# Patient Record
Sex: Female | Born: 1960 | Race: Black or African American | Hispanic: No | Marital: Single | Smoking: Current every day smoker
Health system: Southern US, Community
[De-identification: ages and names within clinical notes are randomized; demographics above are authoritative.]

## PROBLEM LIST (undated history)

## (undated) DIAGNOSIS — M109 Gout, unspecified: Secondary | ICD-10-CM

## (undated) DIAGNOSIS — I10 Essential (primary) hypertension: Secondary | ICD-10-CM

## (undated) HISTORY — PX: ABDOMINAL HYSTERECTOMY: SHX81

---

## 2002-06-06 ENCOUNTER — Ambulatory Visit (HOSPITAL_COMMUNITY): Admission: RE | Admit: 2002-06-06 | Discharge: 2002-06-06 | Payer: Self-pay | Admitting: General Surgery

## 2008-04-20 ENCOUNTER — Emergency Department (HOSPITAL_COMMUNITY): Admission: EM | Admit: 2008-04-20 | Discharge: 2008-04-20 | Payer: Self-pay | Admitting: Emergency Medicine

## 2008-11-12 ENCOUNTER — Encounter: Payer: Self-pay | Admitting: Internal Medicine

## 2008-11-28 ENCOUNTER — Ambulatory Visit (HOSPITAL_COMMUNITY): Admission: RE | Admit: 2008-11-28 | Discharge: 2008-11-28 | Payer: Self-pay | Admitting: Internal Medicine

## 2008-11-28 ENCOUNTER — Ambulatory Visit: Payer: Self-pay | Admitting: Internal Medicine

## 2010-12-15 NOTE — Op Note (Signed)
[name], [name]                 ACCOUNT NO.:  [id number]   MEDICAL RECORD NO.:  [id number]          PATIENT TYPE:  AMB   LOCATION:  DAY                           FACILITY:  [facility]   PHYSICIAN:  R. Roetta Sessions, M.D. DATE OF BIRTH:  03/21/1961   DATE OF PROCEDURE:  11/28/2008  DATE OF DISCHARGE:                               OPERATIVE REPORT   PROCEDURE:  Screening colonoscopy.   INDICATIONS FOR PROCEDURE:  A pleasant 51 year old African American lady  sent over at the courtesy of Dr. Claretta Fraise at [facility] in [place] for screening colonoscopy.  She never had her  lower tract imaged.  She is devoid of any lower GI tract symptoms.  There is no family history of colon polyps or colon cancer.  Colonoscopy  is now being done as a screening maneuver.  Risks, benefits,  alternatives and limitations have been reviewed, questions answered.  She is agreeable.  Please see the documentation in the medical record.   PROCEDURE NOTE:  O2 saturation, blood pressure, pulse and respirations  monitored throughout the entire procedure.  Conscious sedation Versed 4  mg IV, Demerol 75 mg IV in divided doses.  Instrument Pentax video chip  system.   FINDINGS:  Digital rectal exam revealed no abnormalities.  Endoscopic  findings:  The prep was good except for the right side where it was  relatively poor.  Colon:  Colonic mucosa was surveyed from the  rectosigmoid junction through the left transverse right colon to the  appendiceal orifice and ileocecal valve and cecum.  These structures  were well seen and photographed for the record.  From this level the  scope was slowly withdrawn.  All previously mentioned mucosal surfaces  were again seen.  The patient had few scattered pancolonic diverticula.  There was a thin coating of tenacious stool throughout the ascending  colon which in spite of copious washing and lavage was difficult to get  entirely off there.  Therefore the  finer mucosal detail of the ascending  segment was not seen as well if the prep were better.  However, the  patient was noted to have a few scattered pancolonic diverticula.  I saw  no evidence of neoplasia or polyp although a small lesion may have been  obscured by the poor prep on the right side.  The scope was pulled down  into the rectum where thorough examination of the rectal mucosa  including retroflexed view of the anal verge demonstrated no  abnormalities.  The patient tolerated the procedure well and was  reactive after endoscopy.   IMPRESSION:  Normal rectum, few scattered pancolonic diverticular,  suboptimal prep in the ascending segment compromised exam.   RECOMMENDATIONS:  1. Diverticulosis literature provided to Ms. Lorenda Hatchet.  2. Recommend repeat screening colonoscopy 10 years.      Jonathon Bellows, M.D.  Electronically Signed     RMR/MEDQ  D:  11/28/2008  T:  11/28/2008  Job:  [id number]   cc:   Dr Claretta Fraise

## 2010-12-18 NOTE — Op Note (Signed)
   NAMECYNIA, ABRUZZO                           ACCOUNT NO.:  [id number]   MEDICAL RECORD NO.:  [id number]                   PATIENT TYPE:  AMB   LOCATION:  DAY                                  FACILITY:  [facility]   PHYSICIAN:  Dalia Heading, M.D.               DATE OF BIRTH:  [birth year]   DATE OF PROCEDURE:  06/06/2002  DATE OF DISCHARGE:                                 OPERATIVE REPORT   PREOPERATIVE DIAGNOSIS:  Enlarging lipoma, left arm.   POSTOPERATIVE DIAGNOSIS:  Enlarging lipoma, left arm.   PROCEDURE:  Excision of greater than 9 cm lipoma, left arm.   SURGEON:  Dalia Heading, M.D.   ANESTHESIA:  MAC.   COMPLICATIONS:  None.   SPECIMENS:  Lipoma, left arm.   ESTIMATED BLOOD LOSS:  Minimal.   INDICATIONS:  The patient is a 50 year old black female who presents with an  enlarging mass along her left arm.  The risks and benefits of the procedure,  including bleeding, infection, and recurrence of the mass, were fully  explained to the patient, who gave informed consent.   DESCRIPTION OF PROCEDURE:  The patient was placed in the supine position.  The left arm was prepped and draped using the usual sterile technique with  Betadine.  Surgical site confirmation was performed.   Xylocaine 1% was used for local anesthesia.  A longitudinal incision was  made over the mass.  The mass was then excised in total without difficulty.  A large lipoma was found.  Any bleeding was controlled using Bovie  electrocautery.  The subcutaneous layer was reapproximated using a 3-0  Vicryl interrupted suture.  The skin was closed using a 4-0 Monocryl  interrupted suture.  Betadine ointment and dry sterile dressing were  applied.   All tape and needle counts correct at the end of the procedure.  The patient  was transferred to the PACU in stable condition.                                              Dalia Heading, M.D.   MAJ/MEDQ  D:  06/06/2002  T:  06/06/2002  Job:  [id number]   cc:    Dr. Loistine Chance

## 2010-12-18 NOTE — H&P (Signed)
   [name], [name]                             ACCOUNT NO.:  [id number]   MEDICAL RECORD NO.:  [id number]                  PATIENT TYPE:   LOCATION:                                       FACILITY:   PHYSICIAN:  Dalia Heading, M.D.               DATE OF BIRTH:  May 23, 1961   DATE OF ADMISSION:  06/06/2002  DATE OF DISCHARGE:                                HISTORY & PHYSICAL   CHIEF COMPLAINT:  Enlarging mass of left arm.   HISTORY OF PRESENT ILLNESS:  The patient is a 50 year old black female who  was referred for evaluation and treatment of an enlarging mass on the left  arm.  It  has been present for over a year, but recently has increased in  size.  No drainage has been noted.  No neuropathy has been noted.   PAST MEDICAL HISTORY:  Hypertension.   PAST SURGICAL HISTORY:  Hysteroscopy.   CURRENT MEDICATIONS:  1. Procardia.  2. Lasix.  3. Baby aspirin.   ALLERGIES:  HYDROCHLOROTHIAZIDE.   REVIEW OF SYMPTOMS:  The patient does smoke a half of a packs of cigarettes  a day.  She denies any alcohol use.  She denies any other cardiopulmonary  difficulties or bleeding disorders.   PHYSICAL EXAMINATION:  GENERAL APPEARANCE:  The patient is a well-developed,  well-nourished, black female in no acute distress.  VITAL SIGNS:  She is afebrile and vital signs are stable.  LUNGS:  Clear to auscultation with good breath sounds bilaterally.  HEART:  Regular rate and rhythm without S3, S4, or murmurs.  EXTREMITIES:  A 6 cm, ovoid subcutaneous mass noted in the left arm.  No  punctum is present.  The left arm is neurovascularly intact.   IMPRESSION:  Enlarging mass of left arm.    PLAN:  The patient is scheduled for excision of the mass of the left arm on  June 06, 2002.  The risks and benefits of the procedure, including  bleeding, infection, and recurrence of the mass were fully explained to the  patient, who gave informed consent.         Dalia Heading, M.D.    MAJ/MEDQ  D:  05/22/2002  T:  05/22/2002  Job:  [id number]   cc:   Dr. Loistine Chance

## 2011-05-03 LAB — POCT CARDIAC MARKERS
CKMB, poc: 1.8
Myoglobin, poc: 64.9
Troponin i, poc: <0.05

## 2011-05-03 LAB — BASIC METABOLIC PANEL
BUN: 12
Calcium: 8.7
GFR calc non Af Amer: 54 — ABNORMAL LOW
Glucose, Bld: 176 — ABNORMAL HIGH
Potassium: 3.3 — ABNORMAL LOW
Sodium: 138

## 2012-04-02 ENCOUNTER — Encounter (HOSPITAL_COMMUNITY): Payer: Self-pay

## 2012-04-02 ENCOUNTER — Observation Stay (HOSPITAL_COMMUNITY)
Admission: EM | Admit: 2012-04-02 | Discharge: 2012-04-03 | Disposition: A | Discharge status: Home / Self Care | Payer: BC Managed Care – PPO | Attending: Internal Medicine | Admitting: Internal Medicine

## 2012-04-02 ENCOUNTER — Emergency Department (HOSPITAL_COMMUNITY): Payer: BC Managed Care – PPO

## 2012-04-02 DIAGNOSIS — R05 Cough: Secondary | ICD-10-CM | POA: Insufficient documentation

## 2012-04-02 DIAGNOSIS — J449 Chronic obstructive pulmonary disease, unspecified: Secondary | ICD-10-CM | POA: Diagnosis present

## 2012-04-02 DIAGNOSIS — R739 Hyperglycemia, unspecified: Secondary | ICD-10-CM | POA: Diagnosis present

## 2012-04-02 DIAGNOSIS — R059 Cough, unspecified: Secondary | ICD-10-CM | POA: Insufficient documentation

## 2012-04-02 DIAGNOSIS — J9801 Acute bronchospasm: Secondary | ICD-10-CM

## 2012-04-02 DIAGNOSIS — Z720 Tobacco use: Secondary | ICD-10-CM | POA: Diagnosis present

## 2012-04-02 DIAGNOSIS — R7309 Other abnormal glucose: Secondary | ICD-10-CM | POA: Insufficient documentation

## 2012-04-02 DIAGNOSIS — I10 Essential (primary) hypertension: Secondary | ICD-10-CM | POA: Diagnosis present

## 2012-04-02 DIAGNOSIS — J45901 Unspecified asthma with (acute) exacerbation: Secondary | ICD-10-CM

## 2012-04-02 DIAGNOSIS — R062 Wheezing: Secondary | ICD-10-CM | POA: Insufficient documentation

## 2012-04-02 DIAGNOSIS — J441 Chronic obstructive pulmonary disease with (acute) exacerbation: Principal | ICD-10-CM | POA: Insufficient documentation

## 2012-04-02 DIAGNOSIS — R0602 Shortness of breath: Secondary | ICD-10-CM | POA: Insufficient documentation

## 2012-04-02 DIAGNOSIS — J40 Bronchitis, not specified as acute or chronic: Secondary | ICD-10-CM | POA: Diagnosis present

## 2012-04-02 DIAGNOSIS — E669 Obesity, unspecified: Secondary | ICD-10-CM | POA: Diagnosis present

## 2012-04-02 DIAGNOSIS — Z6841 Body Mass Index (BMI) 40.0 and over, adult: Secondary | ICD-10-CM | POA: Insufficient documentation

## 2012-04-02 DIAGNOSIS — F17200 Nicotine dependence, unspecified, uncomplicated: Secondary | ICD-10-CM | POA: Insufficient documentation

## 2012-04-02 HISTORY — DX: Essential (primary) hypertension: I10

## 2012-04-02 MED ORDER — ALBUTEROL SULFATE (5 MG/ML) 0.5% IN NEBU
2.5000 mg | INHALATION_SOLUTION | Freq: Once | RESPIRATORY_TRACT | Status: AC
  Administered 2012-04-02: 2.5 mg via RESPIRATORY_TRACT
  Filled 2012-04-02: qty 0.5

## 2012-04-02 MED ORDER — ALBUTEROL SULFATE HFA 108 (90 BASE) MCG/ACT IN AERS
2.0000 | INHALATION_SPRAY | RESPIRATORY_TRACT | Status: DC | PRN
  Administered 2012-04-02: 2 via RESPIRATORY_TRACT

## 2012-04-02 MED ORDER — ALBUTEROL SULFATE HFA 108 (90 BASE) MCG/ACT IN AERS: 2.0000 | INHALATION_SPRAY | RESPIRATORY_TRACT | Status: DC | PRN

## 2012-04-02 MED ORDER — AMOXICILLIN 500 MG PO CAPS: 500.0000 mg | ORAL_CAPSULE | Freq: Three times a day (TID) | ORAL | Status: DC

## 2012-04-02 MED ORDER — ALBUTEROL SULFATE (5 MG/ML) 0.5% IN NEBU
2.5000 mg | INHALATION_SOLUTION | Freq: Once | RESPIRATORY_TRACT | Status: AC
  Administered 2012-04-03: 2.5 mg via RESPIRATORY_TRACT
  Filled 2012-04-02: qty 0.5

## 2012-04-02 MED ORDER — ALBUTEROL SULFATE HFA 108 (90 BASE) MCG/ACT IN AERS
INHALATION_SPRAY | RESPIRATORY_TRACT | Status: AC
  Filled 2012-04-02: qty 6.7

## 2012-04-02 NOTE — ED Notes (Signed)
Patient presents with tachycardia and tachypnea, extreme work of breathing, 02 sats drop into 80's with any exertion ie attempt to move up in bed.

## 2012-04-02 NOTE — ED Provider Notes (Signed)
History   Scribed for Benny Lennert, MD, the patient was seen in room APA09/APA09 . This chart was scribed by Lewanda Rife.   CSN: [id number]  Arrival date & time 04/02/12  2003   First MD Initiated Contact with Patient 04/02/12 2038      Chief Complaint  Patient presents with  . Shortness of Breath    (Consider location/radiation/quality/duration/timing/severity/associated sxs/prior Treatment) [name] is a 51 y.o. female who presents to the Emergency Department complaining of shortness of breath since last night and worse today. Pt reports associated congestion and productive cough.  Patient is a 51 y.o. female presenting with shortness of breath. The history is provided by the patient.  Shortness of Breath  The current episode started yesterday. The problem occurs frequently. The problem has been resolved. The problem is mild. The symptoms are relieved by one or more prescription drugs and beta-agonist inhalers (pt reports running out of her inhalers). Nothing aggravates the symptoms. Associated symptoms include cough, shortness of breath and wheezing. Pertinent negatives include no fever. The cough is productive.    Past Medical History  Diagnosis Date  . Hypertension     Past Surgical History  Procedure Date  . Abdominal hysterectomy     Family History  Problem Relation Age of Onset  . Diabetes Mother   . Hypertension Mother   . Diabetes Father     History  Substance Use Topics  . Smoking status: Passive Smoker    Types: Cigarettes  . Smokeless tobacco: Not on file  . Alcohol Use: Yes     occasional    OB History    Grav Para Term Preterm Abortions TAB SAB Ect Mult Living                  Review of Systems  Constitutional: Negative.  Negative for fever.  HENT: Negative.   Eyes: Negative for discharge.  Respiratory: Positive for cough, shortness of breath and wheezing.   Cardiovascular: Negative.   Gastrointestinal: Negative.     Genitourinary: Negative for frequency and hematuria.  Musculoskeletal: Negative.   Skin: Negative.   Neurological: Negative.   Hematological: Negative.   Psychiatric/Behavioral: Negative.   All other systems reviewed and are negative.    Allergies  Hctz  Home Medications  No current outpatient prescriptions on file.  BP 195/104  Pulse 126  Temp 98.3 F (36.8 C) (Oral)  Resp 24  Ht 5\' 5"  (1.651 m)  Wt 320 lb (145.151 kg)  BMI 53.25 kg/m2  SpO2 97%  Physical Exam  Nursing note and vitals reviewed. Constitutional: She is oriented to person, place, and time. She appears well-developed.  HENT:  Head: Normocephalic and atraumatic.  Eyes: Conjunctivae and EOM are normal. No scleral icterus.  Neck: Neck supple. No thyromegaly present.  Cardiovascular: Regular rhythm.  Tachycardia present.  Exam reveals no gallop and no friction rub.   No murmur heard. Pulmonary/Chest: No stridor. She has wheezes (minimal bilateral wheezing ). She has no rales. She exhibits no tenderness.  Abdominal: She exhibits no distension. There is no tenderness. There is no rebound.  Musculoskeletal: Normal range of motion. She exhibits no edema.  Lymphadenopathy:    She has no cervical adenopathy.  Neurological: She is oriented to person, place, and time. Coordination normal.  Skin: No rash noted. No erythema.  Psychiatric: She has a normal mood and affect. Her behavior is normal.    ED Course  Procedures (including critical care time)  Labs Reviewed -  No data to display No results found.   No diagnosis found.    MDM        The chart was scribed for me under my direct supervision.  I personally performed the history, physical, and medical decision making and all procedures in the evaluation of this patient.Benny Lennert, MD 04/02/12 2255

## 2012-04-02 NOTE — ED Notes (Signed)
RT paged for tx.

## 2012-04-02 NOTE — ED Notes (Signed)
Pt sts SOB started yesterday afternoon and has become increasingly worse.

## 2012-04-02 NOTE — ED Notes (Signed)
Trial off 02 prior to discharge x 30 minutes

## 2012-04-03 DIAGNOSIS — R739 Hyperglycemia, unspecified: Secondary | ICD-10-CM | POA: Diagnosis present

## 2012-04-03 DIAGNOSIS — Z720 Tobacco use: Secondary | ICD-10-CM | POA: Diagnosis present

## 2012-04-03 DIAGNOSIS — E669 Obesity, unspecified: Secondary | ICD-10-CM | POA: Diagnosis present

## 2012-04-03 DIAGNOSIS — J40 Bronchitis, not specified as acute or chronic: Secondary | ICD-10-CM

## 2012-04-03 DIAGNOSIS — J449 Chronic obstructive pulmonary disease, unspecified: Secondary | ICD-10-CM | POA: Diagnosis present

## 2012-04-03 DIAGNOSIS — J441 Chronic obstructive pulmonary disease with (acute) exacerbation: Secondary | ICD-10-CM

## 2012-04-03 DIAGNOSIS — I10 Essential (primary) hypertension: Secondary | ICD-10-CM | POA: Diagnosis present

## 2012-04-03 LAB — CBC WITH DIFFERENTIAL/PLATELET
Basophils Absolute: 0.1 10*3/uL (ref 0.0–0.1)
Basophils Relative: 1 % (ref 0–1)
Eosinophils Relative: 3 % (ref 0–5)
HCT: 42.9 % (ref 36.0–46.0)
MCHC: 33.1 g/dL (ref 30.0–36.0)
MCV: 90.5 fL (ref 78.0–100.0)
Monocytes Absolute: 0.6 10*3/uL (ref 0.1–1.0)
Platelets: 229 10*3/uL (ref 150–400)
RDW: 13.8 % (ref 11.5–15.5)

## 2012-04-03 LAB — BASIC METABOLIC PANEL
Calcium: 9.2 mg/dL (ref 8.4–10.5)
Creatinine, Ser: 0.9 mg/dL (ref 0.50–1.10)
GFR calc Af Amer: 83 mL/min — ABNORMAL LOW (ref >90)
GFR calc non Af Amer: 72 mL/min — ABNORMAL LOW (ref >90)
Sodium: 138 mEq/L (ref 135–145)

## 2012-04-03 LAB — HEMOGLOBIN A1C: Hgb A1c MFr Bld: 5.9 % — ABNORMAL HIGH (ref <5.7)

## 2012-04-03 MED ORDER — ALBUTEROL SULFATE (5 MG/ML) 0.5% IN NEBU: 2.5000 mg | INHALATION_SOLUTION | RESPIRATORY_TRACT | Status: DC | PRN

## 2012-04-03 MED ORDER — POTASSIUM CHLORIDE CRYS ER 20 MEQ PO TBCR
40.0000 meq | EXTENDED_RELEASE_TABLET | Freq: Two times a day (BID) | ORAL | Status: DC
  Administered 2012-04-03: 40 meq via ORAL
  Filled 2012-04-03: qty 2

## 2012-04-03 MED ORDER — PREDNISONE 20 MG PO TABS
60.0000 mg | ORAL_TABLET | Freq: Every day | ORAL | Status: DC
  Administered 2012-04-03: 60 mg via ORAL
  Filled 2012-04-03: qty 3

## 2012-04-03 MED ORDER — AZITHROMYCIN 250 MG PO TABS
500.0000 mg | ORAL_TABLET | Freq: Every day | ORAL | Status: DC
  Administered 2012-04-03: 500 mg via ORAL
  Filled 2012-04-03: qty 2
  Filled 2012-04-03: qty 1

## 2012-04-03 MED ORDER — IPRATROPIUM BROMIDE 0.02 % IN SOLN
0.5000 mg | Freq: Four times a day (QID) | RESPIRATORY_TRACT | Status: DC
  Administered 2012-04-03 (×2): 0.5 mg via RESPIRATORY_TRACT
  Filled 2012-04-03 (×2): qty 2.5

## 2012-04-03 MED ORDER — AZITHROMYCIN 250 MG PO TABS: 250.0000 mg | ORAL_TABLET | Freq: Every day | ORAL | Status: DC

## 2012-04-03 MED ORDER — ALBUTEROL SULFATE (5 MG/ML) 0.5% IN NEBU
2.5000 mg | INHALATION_SOLUTION | Freq: Four times a day (QID) | RESPIRATORY_TRACT | Status: DC
  Administered 2012-04-03 (×2): 2.5 mg via RESPIRATORY_TRACT
  Filled 2012-04-03 (×2): qty 0.5

## 2012-04-03 MED ORDER — ASPIRIN EC 81 MG PO TBEC
81.0000 mg | DELAYED_RELEASE_TABLET | Freq: Every day | ORAL | Status: DC
  Administered 2012-04-03: 81 mg via ORAL
  Filled 2012-04-03: qty 1

## 2012-04-03 MED ORDER — METOPROLOL TARTRATE 50 MG PO TABS
50.0000 mg | ORAL_TABLET | Freq: Two times a day (BID) | ORAL | Status: DC
  Administered 2012-04-03: 50 mg via ORAL
  Filled 2012-04-03: qty 1

## 2012-04-03 MED ORDER — BIOTENE DRY MOUTH MT LIQD
15.0000 mL | Freq: Two times a day (BID) | OROMUCOSAL | Status: DC
  Administered 2012-04-03: 15 mL via OROMUCOSAL

## 2012-04-03 MED ORDER — NIFEDIPINE ER OSMOTIC RELEASE 30 MG PO TB24
90.0000 mg | ORAL_TABLET | Freq: Every day | ORAL | Status: DC
  Administered 2012-04-03: 90 mg via ORAL
  Filled 2012-04-03: qty 3

## 2012-04-03 MED ORDER — PREDNISONE 10 MG PO TABS: ORAL_TABLET | ORAL | Status: DC

## 2012-04-03 MED ORDER — IPRATROPIUM-ALBUTEROL 18-103 MCG/ACT IN AERO: 2.0000 | INHALATION_SPRAY | RESPIRATORY_TRACT | Status: AC | PRN

## 2012-04-03 NOTE — ED Provider Notes (Signed)
Prior to discharge the patient had low oxygen saturations with ambulation and significant dyspnea. On repeat examination the patient has diffuse bilateral expiratory wheezing, tachypnea, no accessory muscle use. She is obese, strong peripheral pulses but has a tachycardia of 120. She states that she does better after receiving albuterol nebulized treatments but still needs more time. I agree with her assessment, will admit to the hospital. Heparin labs, pending at this time.  Vida Roller, MD 04/03/12 Lyda Jester

## 2012-04-03 NOTE — Progress Notes (Signed)
UR chart review completed.  

## 2012-04-03 NOTE — ED Notes (Signed)
02 sats drop into mid to high 80s with any exertion - walked 10 ft total to and from bathroom.  Discussed with Dr Hyacinth Meeker.  Ambulated patient in hallway - patient stopped to "catch her breath" at first turn at desk, then stopped a second time with half the distance- initial 02 sat 78-80 then with rest and deep breaths returned to high 90's and hit 100% at one time.  Chest auscultated by Dr Hyacinth Meeker.  Patient placed in wheelchair and return to her room

## 2012-04-03 NOTE — ED Notes (Signed)
Up to bathroom prior to discharge - still appears very dyspneic, breathing labored.  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  Has her inhaler and instructions on use.

## 2012-04-03 NOTE — ED Notes (Signed)
Contact Information for Sister:  Allayne Butcher  [phone]

## 2012-04-03 NOTE — Progress Notes (Signed)
[facility] TELEMETRY UNIT [address] [place] [address] Phone: [phone] Fax: [phone]  April 03, 2012  Patient: [name]  Date of Birth: [birth year]  Date of Visit: 04/02/2012    To Whom It May Concern:  Solimar Maiden was seen and treated in our emergency department on 04/02/2012. Addison Lank  may return to work on 04/10/12.  Sincerely,      Crista Curb, M.D.

## 2012-04-03 NOTE — Discharge Summary (Signed)
Physician Discharge Summary  Patient ID: [name] MRN: [id number] DOB/AGE: June 05, 1961 51 y.o.  Admit date: 04/02/2012 Discharge date: 04/03/2012  Discharge Diagnoses:  Principal Problem:  *COPD exacerbation Active Problems:  Bronchitis  Tobacco abuse  Hypertension  Hyperglycemia  Obesity   Medication List  As of 04/03/2012 10:44 AM   TAKE these medications         albuterol-ipratropium 18-103 MCG/ACT inhaler   Commonly known as: COMBIVENT   Inhale 2 puffs into the lungs every 4 (four) hours as needed for wheezing or shortness of breath.      aspirin EC 81 MG tablet   Take 81 mg by mouth daily.      azithromycin 250 MG tablet   Commonly known as: ZITHROMAX   Take 1 tablet (250 mg total) by mouth daily.      metoprolol 50 MG tablet   Commonly known as: LOPRESSOR   Take 50 mg by mouth 2 (two) times daily.      NIFEdipine 90 MG 24 hr tablet   Commonly known as: PROCARDIA XL/ADALAT-CC   Take 90 mg by mouth daily.      predniSONE 10 MG tablet   Commonly known as: DELTASONE   4 tablets daily for 2 days, then decrease by 1 tablet every 2 days until off.            Discharge Orders    Future Orders Please Complete By Expires   Diet - low sodium heart healthy      Increase activity slowly      Discharge instructions      Comments:   Quit smoking      Follow-up Information    Follow up with your primary care provider. (If symptoms worsen)          Disposition: home  Discharged Condition: stable  Consults:  none  Labs:   Results for orders placed during the hospital encounter of 04/02/12 (from the past 48 hour(s))  CBC WITH DIFFERENTIAL     Status: Abnormal   Collection Time   04/03/12  1:31 AM      Component Value Range Comment   WBC 10.8 (*) 4.0 - 10.5 K/uL    RBC 4.74  3.87 - 5.11 MIL/uL    Hemoglobin 14.2  12.0 - 15.0 g/dL    HCT 91.4  78.2 - 95.6 %    MCV 90.5  78.0 - 100.0 fL    MCH 30.0  26.0 - 34.0 pg    MCHC 33.1  30.0 - 36.0 g/dL    RDW  21.3  08.6 - 57.8 %    Platelets 229  150 - 400 K/uL    Neutrophils Relative 73  43 - 77 %    Neutro Abs 7.9 (*) 1.7 - 7.7 K/uL    Lymphocytes Relative 18  12 - 46 %    Lymphs Abs 1.9  0.7 - 4.0 K/uL    Monocytes Relative 6  3 - 12 %    Monocytes Absolute 0.6  0.1 - 1.0 K/uL    Eosinophils Relative 3  0 - 5 %    Eosinophils Absolute 0.3  0.0 - 0.7 K/uL    Basophils Relative 1  0 - 1 %    Basophils Absolute 0.1  0.0 - 0.1 K/uL   BASIC METABOLIC PANEL     Status: Abnormal   Collection Time   04/03/12  1:31 AM      Component Value Range Comment  Sodium 138  135 - 145 mEq/L    Potassium 3.5  3.5 - 5.1 mEq/L    Chloride 103  96 - 112 mEq/L    CO2 23  19 - 32 mEq/L    Glucose, Bld 151 (*) 70 - 99 mg/dL    BUN 13  6 - 23 mg/dL    Creatinine, Ser 1.61  0.50 - 1.10 mg/dL    Calcium 9.2  8.4 - 09.6 mg/dL    GFR calc non Af Amer 72 (*) >90 mL/min    GFR calc Af Amer 83 (*) >90 mL/min   GLUCOSE, CAPILLARY     Status: Abnormal   Collection Time   04/03/12  8:01 AM      Component Value Range Comment   Glucose-Capillary 119 (*) 70 - 99 mg/dL     Diagnostics:  Dg Chest 2 View  04/02/2012  *RADIOLOGY REPORT*  Clinical Data: Hypertension, cough, wheezing  CHEST - 2 VIEW  Comparison: None  Findings: Lordotic positioning. Normal heart size and pulmonary vascularity. Tortuous aorta. Peribronchial thickening without infiltrate or effusion. No pneumothorax. Bones unremarkable.  IMPRESSION: Mild bronchitic changes.   Original Report Authenticated By: Lollie Marrow, M.D.    Procedures:  none  Hospital Course: See H&P for complete admission details. The patient is a 51 year old black female smoker who presented with cough shortness of breath and wheezing. She was given steroids bronchodilators in the emergency room. She improved but had hypoxia with ambulation so she was admitted to the hospitalist service. Chest x-ray is negative. She improved quickly with antibiotics steroids oxygen and bronchodilators.  She was counseled to quit smoking and she wishes to do so. Her oxygen saturations are normal with ambulation this morning and she is requesting discharge. She was given a note for work. She is to take the rest of the week off.  Discharge Exam:  Blood pressure 146/81, pulse 110, temperature 97.8 F (36.6 C), temperature source Oral, resp. rate 20, height 5\' 5"  (1.651 m), weight 145.151 kg (320 lb), SpO2 98.00%.  General: Comfortable. Able to speak in complete sentences. Breathing nonlabored. Coughing. Lungs clear to auscultation bilaterally without wheeze rhonchi or rales Cardiovascular regular rate rhythm without murmurs gallops rubs Abdomen obese soft nontender Extremities no clubbing cyanosis or edema  Signed: Amarah Brossman L 04/03/2012, 10:44 AM

## 2012-04-03 NOTE — Progress Notes (Signed)
Patient is being discharged home. Pt ambulated without oxygen therapy and tolerated ambulation well. Patient given discharge instructions and MD note for work. Pt verbalized understanding of discharge instructions and new medications. Pt advised to progress activity slowly. [name] D 04/03/2012

## 2012-04-03 NOTE — H&P (Signed)
[facility] History and Physical  ALEXEI EY ZOX:[id number] DOB: [birth year] DOA: 04/02/2012  Referring physician: Antonieta Loveless PCP: No primary provider on file.   Chief Complaint: Dyspnea  HPI: [name] is a 51 y.o. female with moderate hypertension followed by [facility] and recurrent bronchitis who presented to ED with worsening dyspnea, wheezing and cough. She has frequent episodes of bronchitis but has never formally been diagnosed with asthma and does not use bronchodilators at home. On arrival she had wheezing, tachycardia and hypoxia on exertion, she was treated with continuous albuterol nebulizer with significant relief and was slated for discharge home, but just before leaving the ED felt her wheezing and dyspnea return and on ambulatory O2 testing desaturated to the 80's. She has no chest pain, a mild croupy non-productive cough, no fevers, and no hemoptysis.   Review of Systems: The patient denies anorexia, fever, weight loss,, vision loss, decreased hearing, hoarseness, chest pain, syncope, peripheral edema, balance deficits, hemoptysis, abdominal pain, melena, hematochezia, severe indigestion/heartburn, hematuria, incontinence, genital sores, muscle weakness, suspicious skin lesions, transient blindness, difficulty walking, depression, unusual weight change, abnormal bleeding, enlarged lymph nodes, angioedema, and breast masses.    Past Medical History  Diagnosis Date  . Hypertension    Past Surgical History  Procedure Date  . Abdominal hysterectomy    Social History:  reports that she has been passively smoking Cigarettes.  She does not have any smokeless tobacco history on file. She reports that she drinks alcohol. She reports that she does not use illicit drugs.  Allergies  Allergen Reactions  . Hctz (Hydrochlorothiazide) Rash    Family History  Problem Relation Age of Onset  . Diabetes Mother   . Hypertension Mother   . Diabetes Father     Prior to Admission medications   Medication Sig Start Date End Date Taking? Authorizing Provider  aspirin EC 81 MG tablet Take 81 mg by mouth daily.   Yes Historical Provider, MD  metoprolol (LOPRESSOR) 50 MG tablet Take 50 mg by mouth 2 (two) times daily.   Yes Historical Provider, MD  NIFEdipine (PROCARDIA XL/ADALAT-CC) 90 MG 24 hr tablet Take 90 mg by mouth daily.   Yes Historical Provider, MD  amoxicillin (AMOXIL) 500 MG capsule Take 1 capsule (500 mg total) by mouth 3 (three) times daily. 04/02/12 04/12/12  Benny Lennert, MD   Physical Exam: Filed Vitals:   04/02/12 2330 04/03/12 0000 04/03/12 0001 04/03/12 0017  BP: 143/93  158/95   Pulse: 121  114 124  Temp:      TempSrc:      Resp: 26  25 31   Height:      Weight:      SpO2: 93% 92% 92% 95%     General:  Obese, no distress, speaks in full sentences  Eyes: PERRL, red conjunctiva  ENT: no exudates or erythema, no nasal congestion  Neck: supple, no adenopathy  Cardiovascular: Tachy, regular no mrg  Respiratory: Diffuse scattered wheezing and anterior rhonchi, no acessory muscle use, fair air movement  Abdomen: soft NT, no HSM  Skin: no rashes  Musculoskeletal: normal  Psychiatric: normal  Neurologic: non-focal  Labs on Admission:  Basic Metabolic Panel:  Lab 04/03/12 8119  NA 138  K 3.5  CL 103  CO2 23  GLUCOSE 151*  BUN 13  CREATININE 0.90  CALCIUM 9.2  MG --  PHOS --   Liver Function Tests: No results found for this basename: AST:5,ALT:5,ALKPHOS:5,BILITOT:5,PROT:5,ALBUMIN:5 in the last 168 hours  No results found for this basename: LIPASE:5,AMYLASE:5 in the last 168 hours No results found for this basename: AMMONIA:5 in the last 168 hours CBC:  Lab 04/03/12 0131  WBC 10.8*  NEUTROABS 7.9*  HGB 14.2  HCT 42.9  MCV 90.5  PLT 229     EKG: Independently reviewed.   Assessment/Plan Principal Problem:  *Asthma exacerbation Active Problems:  Bronchitis  Tobacco abuse  Hypertension   Hyperglycemia   1. Asthma exacerbation/Acute Bronchitis, stable O2 sats above 92% on room air at rest  Scheduled and prn albuterol and atrovent nebulizers  Oral Prednisone 60mg  with rapid taper  Azithromycin, empiric for acute bacterial bronchitis, slightly elevated WBC  Prn O2  2. Hypokalemia, likely from continuous Albuterol  Oral K repletion  Repeat BMET  3. Hypertension  Resumed home medications  4. Hyperglycemia  Will check A1C  5. Tobacco Abuse  Discussed cessation strategies, encouragement provided  Code Status: Full Family Communication: Discussed plan of care with patient Disposition Plan: Home when medically stable  Time spent: 60 min  [facility] Pager [phone]  If 7PM-7AM, please contact night-coverage [email or web address] Password [email or web address] 04/03/2012, 2:23 AM

## 2014-08-03 ENCOUNTER — Encounter (HOSPITAL_COMMUNITY): Payer: Self-pay | Admitting: Emergency Medicine

## 2014-08-03 ENCOUNTER — Emergency Department (HOSPITAL_COMMUNITY)
Admission: EM | Admit: 2014-08-03 | Discharge: 2014-08-03 | Disposition: A | Discharge status: Home / Self Care | Payer: BC Managed Care – PPO | Attending: Emergency Medicine | Admitting: Emergency Medicine

## 2014-08-03 DIAGNOSIS — M19071 Primary osteoarthritis, right ankle and foot: Secondary | ICD-10-CM | POA: Insufficient documentation

## 2014-08-03 DIAGNOSIS — Z79899 Other long term (current) drug therapy: Secondary | ICD-10-CM | POA: Diagnosis not present

## 2014-08-03 DIAGNOSIS — M109 Gout, unspecified: Secondary | ICD-10-CM | POA: Diagnosis not present

## 2014-08-03 DIAGNOSIS — M25571 Pain in right ankle and joints of right foot: Secondary | ICD-10-CM | POA: Diagnosis present

## 2014-08-03 DIAGNOSIS — I10 Essential (primary) hypertension: Secondary | ICD-10-CM | POA: Insufficient documentation

## 2014-08-03 DIAGNOSIS — Z7982 Long term (current) use of aspirin: Secondary | ICD-10-CM | POA: Insufficient documentation

## 2014-08-03 DIAGNOSIS — M722 Plantar fascial fibromatosis: Secondary | ICD-10-CM | POA: Diagnosis not present

## 2014-08-03 DIAGNOSIS — M10071 Idiopathic gout, right ankle and foot: Secondary | ICD-10-CM

## 2014-08-03 HISTORY — DX: Gout, unspecified: M10.9

## 2014-08-03 LAB — BASIC METABOLIC PANEL
ANION GAP: 6 (ref 5–15)
BUN: 16 mg/dL (ref 6–23)
CHLORIDE: 107 meq/L (ref 96–112)
CO2: 27 mmol/L (ref 19–32)
Calcium: 8.8 mg/dL (ref 8.4–10.5)
Creatinine, Ser: 0.92 mg/dL (ref 0.50–1.10)
GFR calc non Af Amer: 69 mL/min — ABNORMAL LOW (ref >90)
GFR, EST AFRICAN AMERICAN: 80 mL/min — AB (ref >90)
Glucose, Bld: 99 mg/dL (ref 70–99)
POTASSIUM: 3.6 mmol/L (ref 3.5–5.1)
Sodium: 140 mmol/L (ref 135–145)

## 2014-08-03 MED ORDER — OXYCODONE-ACETAMINOPHEN 5-325 MG PO TABS
2.0000 | ORAL_TABLET | Freq: Once | ORAL | Status: AC
  Administered 2014-08-03: 2 via ORAL
  Filled 2014-08-03: qty 2

## 2014-08-03 MED ORDER — OXYCODONE-ACETAMINOPHEN 5-325 MG PO TABS: 1.0000 | ORAL_TABLET | ORAL | Status: DC | PRN

## 2014-08-03 MED ORDER — COLCHICINE 0.6 MG PO TABS: 0.6000 mg | ORAL_TABLET | Freq: Every day | ORAL | Status: DC

## 2014-08-03 MED ORDER — INDOMETHACIN 25 MG PO CAPS: 25.0000 mg | ORAL_CAPSULE | Freq: Three times a day (TID) | ORAL | Status: DC | PRN

## 2014-08-03 NOTE — Discharge Instructions (Signed)
Gout °Gout is an inflammatory arthritis caused by a buildup of uric acid crystals in the joints. Uric acid is a chemical that is normally present in the blood. When the level of uric acid in the blood is too high it can form crystals that deposit in your joints and tissues. This causes joint redness, soreness, and swelling (inflammation). Repeat attacks are common. Over time, uric acid crystals can form into masses (tophi) near a joint, destroying bone and causing disfigurement. Gout is treatable and often preventable. °CAUSES  °The disease begins with elevated levels of uric acid in the blood. Uric acid is produced by your body when it breaks down a naturally found substance called purines. Certain foods you eat, such as meats and fish, contain high amounts of purines. Causes of an elevated uric acid level include: °· Being passed down from parent to child (heredity). °· Diseases that cause increased uric acid production (such as obesity, psoriasis, and certain cancers). °· Excessive alcohol use. °· Diet, especially diets rich in meat and seafood. °· Medicines, including certain cancer-fighting medicines (chemotherapy), water pills (diuretics), and aspirin. °· Chronic kidney disease. The kidneys are no longer able to remove uric acid well. °· Problems with metabolism. °Conditions strongly associated with gout include: °· Obesity. °· High blood pressure. °· High cholesterol. °· Diabetes. °Not everyone with elevated uric acid levels gets gout. It is not understood why some people get gout and others do not. Surgery, joint injury, and eating too much of certain foods are some of the factors that can lead to gout attacks. °SYMPTOMS  °· An attack of gout comes on quickly. It causes intense pain with redness, swelling, and warmth in a joint. °· Fever can occur. °· Often, only one joint is involved. Certain joints are more commonly involved: °· Base of the big toe. °· Knee. °· Ankle. °· Wrist. °· Finger. °Without  treatment, an attack usually goes away in a few days to weeks. Between attacks, you usually will not have symptoms, which is different from many other forms of arthritis. °DIAGNOSIS  °Your caregiver will suspect gout based on your symptoms and exam. In some cases, tests may be recommended. The tests may include: °· Blood tests. °· Urine tests. °· X-rays. °· Joint fluid exam. This exam requires a needle to remove fluid from the joint (arthrocentesis). Using a microscope, gout is confirmed when uric acid crystals are seen in the joint fluid. °TREATMENT  °There are two phases to gout treatment: treating the sudden onset (acute) attack and preventing attacks (prophylaxis). °· Treatment of an Acute Attack. °· Medicines are used. These include anti-inflammatory medicines or steroid medicines. °· An injection of steroid medicine into the affected joint is sometimes necessary. °· The painful joint is rested. Movement can worsen the arthritis. °· You may use warm or cold treatments on painful joints, depending which works best for you. °· Treatment to Prevent Attacks. °· If you suffer from frequent gout attacks, your caregiver may advise preventive medicine. These medicines are started after the acute attack subsides. These medicines either help your kidneys eliminate uric acid from your body or decrease your uric acid production. You may need to stay on these medicines for a very long time. °· The early phase of treatment with preventive medicine can be associated with an increase in acute gout attacks. For this reason, during the first few months of treatment, your caregiver may also advise you to take medicines usually used for acute gout treatment. Be sure you   understand your caregiver's directions. Your caregiver may make several adjustments to your medicine dose before these medicines are effective.  Discuss dietary treatment with your caregiver or dietitian. Alcohol and drinks high in sugar and fructose and foods  such as meat, poultry, and seafood can increase uric acid levels. Your caregiver or dietitian can advise you on drinks and foods that should be limited. HOME CARE INSTRUCTIONS   Do not take aspirin to relieve pain. This raises uric acid levels.  Only take over-the-counter or prescription medicines for pain, discomfort, or fever as directed by your caregiver.  Rest the joint as much as possible. When in bed, keep sheets and blankets off painful areas.  Keep the affected joint raised (elevated).  Apply warm or cold treatments to painful joints. Use of warm or cold treatments depends on which works best for you.  Use crutches if the painful joint is in your leg.  Drink enough fluids to keep your urine clear or pale yellow. This helps your body get rid of uric acid. Limit alcohol, sugary drinks, and fructose drinks.  Follow your dietary instructions. Pay careful attention to the amount of protein you eat. Your daily diet should emphasize fruits, vegetables, whole grains, and fat-free or low-fat milk products. Discuss the use of coffee, vitamin C, and cherries with your caregiver or dietitian. These may be helpful in lowering uric acid levels.  Maintain a healthy body weight. SEEK MEDICAL CARE IF:   You develop diarrhea, vomiting, or any side effects from medicines.  You do not feel better in 24 hours, or you are getting worse. SEEK IMMEDIATE MEDICAL CARE IF:   Your joint becomes suddenly more tender, and you have chills or a fever. MAKE SURE YOU:   Understand these instructions.  Will watch your condition.  Will get help right away if you are not doing well or get worse. Document Released: 07/16/2000 Document Revised: 12/03/2013 Document Reviewed: 03/01/2012 [facility] Patient Information 2015 [place], [place]. This information is not intended to replace advice given to you by your health care provider. Make sure you discuss any questions you have with your health care  provider.  Low-Purine Diet Purines are compounds that affect the level of uric acid in your body. A low-purine diet is a diet that is low in purines. Eating a low-purine diet can prevent the level of uric acid in your body from getting too high and causing gout or kidney stones or both. WHAT DO I NEED TO KNOW ABOUT THIS DIET?  Choose low-purine foods. Examples of low-purine foods are listed in the next section.  Drink plenty of fluids, especially water. Fluids can help remove uric acid from your body. Try to drink 8-16 cups (1.9-3.8 L) a day.  Limit foods high in fat, especially saturated fat, as fat makes it harder for the body to get rid of uric acid. Foods high in saturated fat include pizza, cheese, ice cream, whole milk, fried foods, and gravies. Choose foods that are lower in fat and lean sources of protein. Use olive oil when cooking as it contains healthy fats that are not high in saturated fat.  Limit alcohol. Alcohol interferes with the elimination of uric acid from your body. If you are having a gout attack, avoid all alcohol.  Keep in mind that different people's bodies react differently to different foods. You will probably learn over time which foods do or do not affect you. If you discover that a food tends to cause your gout to flare  up, avoid eating that food. You can more freely enjoy foods that do not cause problems. If you have any questions about a food item, talk to your dietitian or health care provider. WHICH FOODS ARE LOW, MODERATE, AND HIGH IN PURINES? The following is a list of foods that are low, moderate, and high in purines. You can eat any amount of the foods that are low in purines. You may be able to have small amounts of foods that are moderate in purines. Ask your health care provider how much of a food moderate in purines you can have. Avoid foods high in purines. Grains  Foods low in purines: Enriched white bread, pasta, rice, cake, cornbread, popcorn.  Foods  moderate in purines: Whole-grain breads and cereals, wheat germ, bran, oatmeal. Uncooked oatmeal. Dry wheat bran or wheat germ.  Foods high in purines: Pancakes, [place] toast, biscuits, muffins. Vegetables  Foods low in purines: All vegetables, except those that are moderate in purines.  Foods moderate in purines: Asparagus, cauliflower, spinach, mushrooms, green peas. Fruits  All fruits are low in purines. Meats and other Protein Foods  Foods low in purines: Eggs, nuts, peanut butter.  Foods moderate in purines: 80-90% lean beef, lamb, veal, pork, poultry, fish, eggs, peanut butter, nuts. Crab, lobster, oysters, and shrimp. Cooked dried beans, peas, and lentils.  Foods high in purines: Anchovies, sardines, herring, mussels, tuna, codfish, scallops, trout, and haddock. [name]. Organ meats (such as liver or kidney). Tripe. Game meat. Goose. Sweetbreads. Dairy  All dairy foods are low in purines. Low-fat and fat-free dairy products are best because they are low in saturated fat. Beverages  Drinks low in purines: Water, carbonated beverages, tea, coffee, cocoa.  Drinks moderate in purines: Soft drinks and other drinks sweetened with high-fructose corn syrup. Juices. To find whether a food or drink is sweetened with high-fructose corn syrup, look at the ingredients list.  Drinks high in purines: Alcoholic beverages (such as beer). Condiments  Foods low in purines: Salt, herbs, olives, pickles, relishes, vinegar.  Foods moderate in purines: Butter, margarine, oils, mayonnaise. Fats and Oils  Foods low in purines: All types, except gravies and sauces made with meat.  Foods high in purines: Gravies and sauces made with meat. Other Foods  Foods low in purines: Sugars, sweets, gelatin. Cake. Soups made without meat.  Foods moderate in purines: Meat-based or fish-based soups, broths, or bouillons. Foods and drinks sweetened with high-fructose corn syrup.  Foods high in purines:  High-fat desserts (such as ice cream, cookies, cakes, pies, doughnuts, and chocolate). Contact your dietitian for more information on foods that are not listed here. Document Released: 11/13/2010 Document Revised: 07/24/2013 Document Reviewed: 06/25/2013 [facility] Patient Information 2015 [place], [place]. This information is not intended to replace advice given to you by your health care provider. Make sure you discuss any questions you have with your health care provider.  Plantar Fasciitis Plantar fasciitis is a common condition that causes foot pain. It is soreness (inflammation) of the band of tough fibrous tissue on the bottom of the foot that runs from the heel bone (calcaneus) to the ball of the foot. The cause of this soreness may be from excessive standing, poor fitting shoes, running on hard surfaces, being overweight, having an abnormal walk, or overuse (this is common in runners) of the painful foot or feet. It is also common in aerobic exercise dancers and ballet dancers. SYMPTOMS  Most people with plantar fasciitis complain of:  Severe pain in the morning on  the bottom of their foot especially when taking the first steps out of bed. This pain recedes after a few minutes of walking.  Severe pain is experienced also during walking following a long period of inactivity.  Pain is worse when walking barefoot or up stairs DIAGNOSIS   Your caregiver will diagnose this condition by examining and feeling your foot.  Special tests such as X-rays of your foot, are usually not needed. PREVENTION   Consult a sports medicine professional before beginning a new exercise program.  Walking programs offer a good workout. With walking there is a lower chance of overuse injuries common to runners. There is less impact and less jarring of the joints.  Begin all new exercise programs slowly. If problems or pain develop, decrease the amount of time or distance until you are at a comfortable  level.  Wear good shoes and replace them regularly.  Stretch your foot and the heel cords at the back of the ankle (Achilles tendon) both before and after exercise.  Run or exercise on even surfaces that are not hard. For example, asphalt is better than pavement.  Do not run barefoot on hard surfaces.  If using a treadmill, vary the incline.  Do not continue to workout if you have foot or joint problems. Seek professional help if they do not improve. HOME CARE INSTRUCTIONS   Avoid activities that cause you pain until you recover.  Use ice or cold packs on the problem or painful areas after working out.  Only take over-the-counter or prescription medicines for pain, discomfort, or fever as directed by your caregiver.  Soft shoe inserts or athletic shoes with air or gel sole cushions may be helpful.  If problems continue or become more severe, consult a sports medicine caregiver or your own health care provider. Cortisone is a potent anti-inflammatory medication that may be injected into the painful area. You can discuss this treatment with your caregiver. MAKE SURE YOU:   Understand these instructions.  Will watch your condition.  Will get help right away if you are not doing well or get worse. Document Released: 04/13/2001 Document Revised: 10/11/2011 Document Reviewed: 06/12/2008 [facility] Patient Information 2015 [place], [place]. This information is not intended to replace advice given to you by your health care provider. Make sure you discuss any questions you have with your health care provider.

## 2014-08-03 NOTE — ED Notes (Signed)
Pt verbalized understanding of no driving and to use caution within 4 hours of taking pain meds due to meds cause drowsiness 

## 2014-08-03 NOTE — ED Provider Notes (Signed)
TIME SEEN: 7:00 PM  CHIEF COMPLAINT: Bilateral foot pain  HPI: Pt is a 54 y.o. F with history of hypertension and gout who presents emergency department with bilateral foot pain started yesterday. Denies any known injury. States that her right first toe is hurting in this feels similar to her prior episodes of gout. States that she is having a sharp pain worse in the plantar aspect of the left foot that is worse with ambulation and worse in the morning. She has never had this pain before. No swelling, numbness, tingling or focal weakness.  ROS: See HPI Constitutional: no fever  Eyes: no drainage  ENT: no runny nose   Cardiovascular:  no chest pain  Resp: no SOB  GI: no vomiting GU: no dysuria Integumentary: no rash  Allergy: no hives  Musculoskeletal: no leg swelling  Neurological: no slurred speech ROS otherwise negative  PAST MEDICAL HISTORY/PAST SURGICAL HISTORY:  Past Medical History  Diagnosis Date  . Hypertension   . Gout     MEDICATIONS:  Prior to Admission medications   Medication Sig Start Date End Date Taking? Authorizing Provider  ibuprofen (ADVIL,MOTRIN) 200 MG tablet Take 400 mg by mouth every 6 (six) hours as needed for mild pain.   Yes Historical Provider, MD  indomethacin (INDOCIN) 50 MG capsule Take 50 mg by mouth daily as needed. 07/25/14  Yes Historical Provider, MD  lisinopril (PRINIVIL,ZESTRIL) 30 MG tablet Take 30 mg by mouth daily. 07/02/14  Yes Historical Provider, MD  metoprolol (LOPRESSOR) 50 MG tablet Take 50 mg by mouth 2 (two) times daily.   Yes Historical Provider, MD  NIFEdipine (PROCARDIA XL/ADALAT-CC) 90 MG 24 hr tablet Take 90 mg by mouth daily.   Yes Historical Provider, MD  albuterol-ipratropium (COMBIVENT) 18-103 MCG/ACT inhaler Inhale 2 puffs into the lungs every 4 (four) hours as needed for wheezing or shortness of breath. Patient not taking: Reported on 08/03/2014 04/03/12 04/03/13  Christiane Ha, MD  aspirin EC 81 MG tablet Take 81 mg by  mouth daily.    Historical Provider, MD  azithromycin (ZITHROMAX) 250 MG tablet Take 1 tablet (250 mg total) by mouth daily. Patient not taking: Reported on 08/03/2014 04/03/12   Christiane Ha, MD  predniSONE (DELTASONE) 10 MG tablet 4 tablets daily for 2 days, then decrease by 1 tablet every 2 days until off. Patient not taking: Reported on 08/03/2014 04/03/12   Christiane Ha, MD    ALLERGIES:  Allergies  Allergen Reactions  . Hctz [Hydrochlorothiazide] Rash    SOCIAL HISTORY:  History  Substance Use Topics  . Smoking status: Passive Smoke Exposure - Never Smoker    Types: Cigarettes  . Smokeless tobacco: Never Used  . Alcohol Use: Yes     Comment: occasional    FAMILY HISTORY: Family History  Problem Relation Age of Onset  . Diabetes Mother   . Hypertension Mother   . Diabetes Father     EXAM: BP 149/84 mmHg  Pulse 111  Temp(Src) 99.4 F (37.4 C) (Oral)  Resp 20  Ht  (1.676 m)  Wt 313 lb (141.976 kg)  BMI 50.54 kg/m2  SpO2 100% CONSTITUTIONAL: Alert and oriented and responds appropriately to questions. Well-appearing; well-nourished HEAD: Normocephalic EYES: Conjunctivae clear, PERRL ENT: normal nose; no rhinorrhea; moist mucous membranes; pharynx without lesions noted NECK: Supple, no meningismus, no LAD  CARD: RRR; S1 and S2 appreciated; no murmurs, no clicks, no rubs, no gallops RESP: Normal chest excursion without splinting or tachypnea; breath  sounds clear and equal bilaterally; no wheezes, no rhonchi, no rales,  ABD/GI: Normal bowel sounds; non-distended; soft, non-tender, no rebound, no guarding BACK:  The back appears normal and is non-tender to palpation, there is no CVA tenderness EXT: Tender to palpation over the right first metatarsal with some mild warmth but no erythema or joint effusion, 2+ DP pulses bilaterally, patient is tender to palpation over the insertion of the plantar fascia of the left foot but there is no associated erythema or  warmth or fluctuance or induration, no joint effusions, sensation to light touch intact diffusely, no calf tenderness or swelling, otherwise Normal ROM in all joints; otherwise extremities are non-tender to palpation; no edema; normal capillary refill; no cyanosis    SKIN: Normal color for age and race; warm NEURO: Moves all extremities equally PSYCH: The patient's mood and manner are appropriate. Grooming and personal hygiene are appropriate.  MEDICAL DECISION MAKING: Patient here with gout to the right first toe and likely plantar fasciitis to the left foot. We'll give Percocet for pain. She has not been in the emergency department since 2013. Will check creatinine. If normal will discharge on Indocin and culture seen.  ED PROGRESS: Patient's creatinine is normal. We'll discharge with colchicine, Indocin and Percocet. Discussed return precautions and supportive care instructions. Patient verbalizes understanding and is comfortable with plan.   She reports feeling much better. Given her history of injury do not feel she needs an x-ray at this time and she agrees. No signs of septic arthritis on exam.     Layla Maw Caroleann Casler, DO 08/03/14 [id number]

## 2014-08-03 NOTE — ED Notes (Addendum)
Patient c/o bilateral ankle and foot pain that started yesterday. Denies any known injury or swelling. Patient states "It feels like someone is jabbing needles in the bottom of my feet. Denies being diabetic. Per patient unable to bear weight, can not walk due to pain.

## 2015-10-28 ENCOUNTER — Emergency Department (HOSPITAL_COMMUNITY): Payer: BC Managed Care – PPO

## 2015-10-28 ENCOUNTER — Emergency Department (HOSPITAL_COMMUNITY)
Admission: EM | Admit: 2015-10-28 | Discharge: 2015-10-28 | Disposition: A | Discharge status: Home / Self Care | Payer: BC Managed Care – PPO | Attending: Emergency Medicine | Admitting: Emergency Medicine

## 2015-10-28 ENCOUNTER — Encounter (HOSPITAL_COMMUNITY): Payer: Self-pay | Admitting: Emergency Medicine

## 2015-10-28 DIAGNOSIS — Z7722 Contact with and (suspected) exposure to environmental tobacco smoke (acute) (chronic): Secondary | ICD-10-CM | POA: Diagnosis not present

## 2015-10-28 DIAGNOSIS — R1013 Epigastric pain: Secondary | ICD-10-CM | POA: Diagnosis present

## 2015-10-28 DIAGNOSIS — Z79899 Other long term (current) drug therapy: Secondary | ICD-10-CM | POA: Diagnosis not present

## 2015-10-28 DIAGNOSIS — K8000 Calculus of gallbladder with acute cholecystitis without obstruction: Secondary | ICD-10-CM | POA: Diagnosis not present

## 2015-10-28 DIAGNOSIS — I10 Essential (primary) hypertension: Secondary | ICD-10-CM | POA: Insufficient documentation

## 2015-10-28 DIAGNOSIS — K8010 Calculus of gallbladder with chronic cholecystitis without obstruction: Secondary | ICD-10-CM

## 2015-10-28 LAB — CBC WITH DIFFERENTIAL/PLATELET
BASOS ABS: 0.1 10*3/uL (ref 0.0–0.1)
Basophils Relative: 1 %
Eosinophils Absolute: 0.2 10*3/uL (ref 0.0–0.7)
Eosinophils Relative: 3 %
HEMATOCRIT: 44.1 % (ref 36.0–46.0)
HEMOGLOBIN: 14.8 g/dL (ref 12.0–15.0)
LYMPHS PCT: 25 %
Lymphs Abs: 2 10*3/uL (ref 0.7–4.0)
MCH: 31.2 pg (ref 26.0–34.0)
MCHC: 33.6 g/dL (ref 30.0–36.0)
MCV: 92.8 fL (ref 78.0–100.0)
MONO ABS: 0.6 10*3/uL (ref 0.1–1.0)
MONOS PCT: 8 %
NEUTROS ABS: 5.1 10*3/uL (ref 1.7–7.7)
Neutrophils Relative %: 63 %
Platelets: 242 10*3/uL (ref 150–400)
RBC: 4.75 MIL/uL (ref 3.87–5.11)
RDW: 13.9 % (ref 11.5–15.5)
WBC: 8 10*3/uL (ref 4.0–10.5)

## 2015-10-28 LAB — COMPREHENSIVE METABOLIC PANEL
ALK PHOS: 122 U/L (ref 38–126)
ALT: 19 U/L (ref 14–54)
AST: 25 U/L (ref 15–41)
Albumin: 3.9 g/dL (ref 3.5–5.0)
Anion gap: 11 (ref 5–15)
BILIRUBIN TOTAL: 0.8 mg/dL (ref 0.3–1.2)
BUN: 15 mg/dL (ref 6–20)
CALCIUM: 8.6 mg/dL — AB (ref 8.9–10.3)
CO2: 20 mmol/L — ABNORMAL LOW (ref 22–32)
CREATININE: 0.82 mg/dL (ref 0.44–1.00)
Chloride: 108 mmol/L (ref 101–111)
GFR calc Af Amer: >60 mL/min (ref >60)
Glucose, Bld: 158 mg/dL — ABNORMAL HIGH (ref 65–99)
POTASSIUM: 3.8 mmol/L (ref 3.5–5.1)
Sodium: 139 mmol/L (ref 135–145)
Total Protein: 7.3 g/dL (ref 6.5–8.1)

## 2015-10-28 LAB — TROPONIN I: Troponin I: <0.03 ng/mL (ref <0.031)

## 2015-10-28 LAB — URINALYSIS, ROUTINE W REFLEX MICROSCOPIC
BILIRUBIN URINE: NEGATIVE
Glucose, UA: NEGATIVE mg/dL
Hgb urine dipstick: NEGATIVE
Ketones, ur: NEGATIVE mg/dL
LEUKOCYTES UA: NEGATIVE
NITRITE: NEGATIVE
PH: 7 (ref 5.0–8.0)
Protein, ur: NEGATIVE mg/dL
SPECIFIC GRAVITY, URINE: 1.01 (ref 1.005–1.030)

## 2015-10-28 LAB — LIPASE, BLOOD: Lipase: 27 U/L (ref 11–51)

## 2015-10-28 MED ORDER — RANITIDINE HCL 150 MG PO TABS: 150.0000 mg | ORAL_TABLET | Freq: Two times a day (BID) | ORAL | Status: DC

## 2015-10-28 MED ORDER — ONDANSETRON HCL 4 MG/2ML IJ SOLN
4.0000 mg | Freq: Once | INTRAMUSCULAR | Status: AC
  Administered 2015-10-28: 4 mg via INTRAVENOUS
  Filled 2015-10-28: qty 2

## 2015-10-28 MED ORDER — HYDROMORPHONE HCL 1 MG/ML IJ SOLN
0.5000 mg | INTRAMUSCULAR | Status: DC | PRN
  Administered 2015-10-28: 0.5 mg via INTRAVENOUS
  Filled 2015-10-28: qty 1

## 2015-10-28 MED ORDER — OXYCODONE-ACETAMINOPHEN 5-325 MG PO TABS: 1.0000 | ORAL_TABLET | Freq: Four times a day (QID) | ORAL | Status: DC | PRN

## 2015-10-28 MED ORDER — PANTOPRAZOLE SODIUM 20 MG PO TBEC: 40.0000 mg | DELAYED_RELEASE_TABLET | Freq: Every day | ORAL | Status: AC

## 2015-10-28 NOTE — Discharge Instructions (Signed)
You ultrasound reveals gallstones and sludge in the gallbladder. Please see Dr Lovell SheehanJenkins in the office as soon as possible for evaluation. Please avoid high fat, and spicy foods.  Use protonix and zantac daily. Use percocet for pain if needed. This medication may cause drowsiness and constipation. Please use a stool softener while taking this mediation.  Cholelithiasis Cholelithiasis (also called gallstones) is a form of gallbladder disease. The gallbladder is a small organ that helps you digest fats. Symptoms of gallstones are:  Feeling sick to your stomach (nausea).  Throwing up (vomiting).  Belly pain.  Yellowing of the skin (jaundice).  Sudden pain. You may feel the pain for minutes to hours.  Fever.  Pain to the touch. HOME CARE  Only take medicines as told by your doctor.  Eat a low-fat diet until you see your doctor again. Eating fat can result in pain.  Follow up with your doctor as told. Attacks usually happen time after time. Surgery is usually needed for permanent treatment. GET HELP RIGHT AWAY IF:   Your pain gets worse.  Your pain is not helped by medicines.  You have a fever and lasting symptoms for more than 2-3 days.  You have a fever and your symptoms suddenly get worse.  You keep feeling sick to your stomach and throwing up. MAKE SURE YOU:   Understand these instructions.  Will watch your condition.  Will get help right away if you are not doing well or get worse.   This information is not intended to replace advice given to you by your health care provider. Make sure you discuss any questions you have with your health care provider.   Document Released: 01/05/2008 Document Revised: 03/21/2013 Document Reviewed: 01/10/2013 [employer] Interactive Patient Education [employer]2016 [employer]

## 2015-10-28 NOTE — ED Notes (Signed)
Patient was asleep when I went into room to do repeat EKG

## 2015-10-28 NOTE — ED Provider Notes (Signed)
CSN: [id number]     Arrival date & time 10/28/15  0754 History   First MD Initiated Contact with Patient 10/28/15 [phone]     Chief Complaint  Patient presents with  . Abdominal Pain     (Consider location/radiation/quality/duration/timing/severity/associated sxs/prior Treatment) HPI Comments: Patient is a 55 year old female who presents to the emergency department with epigastric area abdominal pain.  The patient states about 2 AM she was awakened with pain in the gastric area she describes as both burning and achy type pain. The pain radiates a little to the left thalamic area. Patient states she's been eating her over the last couple days, she has noticed that her stools are little more loose, she did have one diarrhea stool earlier this morning. She felt better after passing stool. She had nausea, but no actual vomiting. This been no injury or trauma to the abdomen. The patient does not have any recent abdominal surgery. The patient states that she uses NSAIDs, but does not feel that she uses them in excess. She denies being on any anticoagulation medications. Medications, and no recent diet changes. The pain is aggravated by sitting up. The pain is made of somewhat improved with the patient lying on her side and splinting the abdomen area.  Patient is a 55 y.o. female presenting with abdominal pain. The history is provided by the patient.  Abdominal Pain Associated symptoms: diarrhea and nausea   Associated symptoms: no chest pain, no chills, no fever, no shortness of breath and no vomiting     Past Medical History  Diagnosis Date  . Hypertension   . Gout    Past Surgical History  Procedure Laterality Date  . Abdominal hysterectomy     Family History  Problem Relation Age of Onset  . Diabetes Mother   . Hypertension Mother   . Diabetes Father    Social History  Substance Use Topics  . Smoking status: Passive Smoke Exposure - Never Smoker    Types: Cigarettes  . Smokeless  tobacco: Never Used  . Alcohol Use: Yes     Comment: occasional   OB History    Gravida Para Term Preterm AB TAB SAB Ectopic Multiple Living   3 1  1 2  2   1      Review of Systems  Constitutional: Negative for fever and chills.  Respiratory: Negative for shortness of breath.   Cardiovascular: Negative for chest pain.  Gastrointestinal: Positive for nausea, abdominal pain and diarrhea. Negative for vomiting.  Musculoskeletal: Positive for arthralgias.  All other systems reviewed and are negative.     Allergies  Hctz  Home Medications   Prior to Admission medications   Medication Sig Start Date End Date Taking? Authorizing Provider  albuterol-ipratropium (COMBIVENT) 18-103 MCG/ACT inhaler Inhale 2 puffs into the lungs every 4 (four) hours as needed for wheezing or shortness of breath. Patient not taking: Reported on 08/03/2014 04/03/12 04/03/13  Christiane Haorinna L Sullivan, MD  aspirin EC 81 MG tablet Take 81 mg by mouth daily.    Historical Provider, MD  azithromycin (ZITHROMAX) 250 MG tablet Take 1 tablet (250 mg total) by mouth daily. Patient not taking: Reported on 08/03/2014 04/03/12   Christiane Haorinna L Sullivan, MD  colchicine 0.6 MG tablet Take 1 tablet (0.6 mg total) by mouth daily. 08/03/14   Kristen N Ward, DO  ibuprofen (ADVIL,MOTRIN) 200 MG tablet Take 400 mg by mouth every 6 (six) hours as needed for mild pain.    Historical Provider, MD  indomethacin (  INDOCIN) 25 MG capsule Take 1 capsule (25 mg total) by mouth 3 (three) times daily as needed. 08/03/14   Kristen N Ward, DO  indomethacin (INDOCIN) 50 MG capsule Take 50 mg by mouth daily as needed. 07/25/14   Historical Provider, MD  lisinopril (PRINIVIL,ZESTRIL) 30 MG tablet Take 30 mg by mouth daily. 07/02/14   Historical Provider, MD  metoprolol (LOPRESSOR) 50 MG tablet Take 50 mg by mouth 2 (two) times daily.    Historical Provider, MD  NIFEdipine (PROCARDIA XL/ADALAT-CC) 90 MG 24 hr tablet Take 90 mg by mouth daily.    Historical Provider, MD   oxyCODONE-acetaminophen (PERCOCET/ROXICET) 5-325 MG per tablet Take 1 tablet by mouth every 4 (four) hours as needed. 08/03/14   Kristen N Ward, DO  predniSONE (DELTASONE) 10 MG tablet 4 tablets daily for 2 days, then decrease by 1 tablet every 2 days until off. Patient not taking: Reported on 08/03/2014 04/03/12   Christiane Ha, MD   There were no vitals taken for this visit. Physical Exam  Constitutional: She is oriented to person, place, and time. She appears well-developed and well-nourished.  Non-toxic appearance.  HENT:  Head: Normocephalic.  Right Ear: Tympanic membrane and external ear normal.  Left Ear: Tympanic membrane and external ear normal.  Eyes: EOM and lids are normal. Pupils are equal, round, and reactive to light.  Neck: Normal range of motion. Neck supple. Carotid bruit is not present.  Cardiovascular: Normal rate, regular rhythm, normal heart sounds, intact distal pulses and normal pulses.   Pulmonary/Chest: Breath sounds normal. No respiratory distress.  Abdominal: Soft. Bowel sounds are normal. There is no hepatosplenomegaly. There is tenderness in the epigastric area. There is no rigidity and no guarding.  Musculoskeletal: Normal range of motion.  Lymphadenopathy:       Head (right side): No submandibular adenopathy present.       Head (left side): No submandibular adenopathy present.    She has no cervical adenopathy.  Neurological: She is alert and oriented to person, place, and time. She has normal strength. No cranial nerve deficit or sensory deficit.  Skin: Skin is warm and dry.  Psychiatric: She has a normal mood and affect. Her speech is normal.  Nursing note and vitals reviewed.   ED Course  Procedures (including critical care time) Labs Review Labs Reviewed - No data to display  Imaging Review No results found. I have personally reviewed and evaluated these images and lab results as part of my medical decision-making.   EKG Interpretation None       MDM Vital signs reviewed. UA and CBC wnl. Lipase wnl. Troponin negative for acute event.  Pt has some abnormality of EKG with normal troponin, no chest pain or shortness of breath. I have ask her to see her Primary MD for recheck of the EKG changes.  Second EKG neg for acute abnormalities. Ultrasound reveals Gallstone and sludge with gall bladder wall thickening. Suspect Cholecystitis. Rx for protonix and zantac given to pt. Pt will use percocet for pain if needed. Pt referred to Dr Lovell Sheehan for surgical evaluation.   Final diagnoses:  Cholecystitis with cholelithiasis    **I have reviewed nursing notes, vital signs, and all appropriate lab and imaging results for this patient.Ivery Quale, PA-C 10/30/15 2053  Zadie Rhine, MD 11/01/15 [phone]

## 2015-10-28 NOTE — ED Notes (Signed)
Woke up about 2 am with upper gastric burning.  Ate turnip, green beans, corn bread and barbeque chicken at 1800.  Had 3 BM's since eating last and pain did ease once bowels movement.  Denies any other pain or discomfort.

## 2015-10-28 NOTE — ED Provider Notes (Signed)
Patient seen/examined in the Emergency Department in conjunction with Midlevel Provider [name] Patient reports upper abdominal pain.  Denies CP Exam : awake/alert, mild diffuse abd tenderness Plan: she has abnormal EKG, though no STEMI noted.  She denies CP.  Will continue with GI workup at this time.  Troponin has been ordered    Deanna Rhineonald Santiana Glidden, MD 10/28/15 [phone]

## 2015-10-28 NOTE — ED Provider Notes (Signed)
Repeat EKG unchanged Troponin negative US pending   EKG Interpretation  Date/Time:  Tuesday October 28 2015 10:04:33 EDT Ventricular Rate:  68 PR Interval:  146 QRS Duration: 98 QT Interval:  433 QTC Calculation: 460 R Axis:   22 Text Interpretation:  Sinus rhythm Anteroseptal infarct, old No significant change since last tracing Confirmed by Bebe ShaggyWICKLINE  MD, Ahmira Boisselle ([id number]) on 10/28/2015 10:10:14 AM        Deanna Rhineonald Afton Mikelson, MD 10/28/15 1010

## 2016-06-06 ENCOUNTER — Emergency Department (HOSPITAL_COMMUNITY): Payer: BC Managed Care – PPO

## 2016-06-06 ENCOUNTER — Encounter (HOSPITAL_COMMUNITY): Payer: Self-pay | Admitting: Emergency Medicine

## 2016-06-06 ENCOUNTER — Emergency Department (HOSPITAL_COMMUNITY)
Admission: EM | Admit: 2016-06-06 | Discharge: 2016-06-06 | Disposition: A | Discharge status: Home / Self Care | Payer: BC Managed Care – PPO | Attending: Emergency Medicine | Admitting: Emergency Medicine

## 2016-06-06 DIAGNOSIS — Z79899 Other long term (current) drug therapy: Secondary | ICD-10-CM | POA: Diagnosis not present

## 2016-06-06 DIAGNOSIS — I10 Essential (primary) hypertension: Secondary | ICD-10-CM | POA: Insufficient documentation

## 2016-06-06 DIAGNOSIS — M10042 Idiopathic gout, left hand: Secondary | ICD-10-CM | POA: Diagnosis not present

## 2016-06-06 DIAGNOSIS — Z7982 Long term (current) use of aspirin: Secondary | ICD-10-CM | POA: Diagnosis not present

## 2016-06-06 DIAGNOSIS — M79642 Pain in left hand: Secondary | ICD-10-CM | POA: Diagnosis present

## 2016-06-06 DIAGNOSIS — Z7722 Contact with and (suspected) exposure to environmental tobacco smoke (acute) (chronic): Secondary | ICD-10-CM | POA: Insufficient documentation

## 2016-06-06 DIAGNOSIS — J441 Chronic obstructive pulmonary disease with (acute) exacerbation: Secondary | ICD-10-CM | POA: Diagnosis not present

## 2016-06-06 MED ORDER — HYDROCODONE-ACETAMINOPHEN 5-325 MG PO TABS: 2.0000 | ORAL_TABLET | ORAL | 0 refills | Status: DC | PRN

## 2016-06-06 MED ORDER — PREDNISONE 10 MG PO TABS: ORAL_TABLET | ORAL | 0 refills | Status: DC

## 2016-06-06 MED ORDER — INDOMETHACIN 25 MG PO CAPS: 25.0000 mg | ORAL_CAPSULE | Freq: Three times a day (TID) | ORAL | 0 refills | Status: AC | PRN

## 2016-06-06 NOTE — ED Notes (Signed)
Returned from xray

## 2016-06-06 NOTE — ED Notes (Signed)
Pt verbalized understanding of no driving and to use caution within 4 hours of taking pain meds due to meds cause drowsiness.  Also spoke with pt with follow up with her PCP and management of her gout.

## 2016-06-06 NOTE — ED Notes (Signed)
Patient transported to X-ray 

## 2016-06-06 NOTE — ED Triage Notes (Signed)
Pt reports L hand pain and edema that started 1 week ago. Pt has hx of gout. Denies known injury.

## 2016-06-06 NOTE — ED Provider Notes (Signed)
[facility]-EMERGENCY DEPT Provider Note   CSN: [id number] Arrival date & time: 06/06/16  1202     History   Chief Complaint Chief Complaint  Patient presents with  . Hand Pain    HPI Deanna LankSarah B Casey is a 55 y.o. female.  The history is provided by the patient. No language interpreter was used.  Hand Pain  This is a new problem. The current episode started more than 2 days ago. The problem occurs constantly. The problem has been gradually worsening. Nothing aggravates the symptoms. Nothing relieves the symptoms. She has tried nothing for the symptoms.  Pt reports she has swelling and pain to her hand.  Pt has a history of gout.    Past Medical History:  Diagnosis Date  . Gout   . Hypertension     Patient Active Problem List   Diagnosis Date Noted  . COPD exacerbation (HCC) 04/03/2012  . Bronchitis 04/03/2012  . Tobacco abuse 04/03/2012  . Hypertension 04/03/2012  . Hyperglycemia 04/03/2012  . Obesity 04/03/2012    Past Surgical History:  Procedure Laterality Date  . ABDOMINAL HYSTERECTOMY      OB History    Gravida Para Term Preterm AB Living   3 1   1 2 1    SAB TAB Ectopic Multiple Live Births   2               Home Medications    Prior to Admission medications   Medication Sig Start Date End Date Taking? Authorizing Provider  albuterol-ipratropium (COMBIVENT) 18-103 MCG/ACT inhaler Inhale 2 puffs into the lungs every 4 (four) hours as needed for wheezing or shortness of breath. Patient not taking: Reported on 08/03/2014 04/03/12 04/03/13  Christiane Haorinna L Sullivan, MD  aspirin EC 81 MG tablet Take 81 mg by mouth daily.    Historical Provider, MD  azithromycin (ZITHROMAX) 250 MG tablet Take 1 tablet (250 mg total) by mouth daily. Patient not taking: Reported on 08/03/2014 04/03/12   Christiane Haorinna L Sullivan, MD  colchicine 0.6 MG tablet Take 1 tablet (0.6 mg total) by mouth daily. Patient not taking: Reported on 10/28/2015 08/03/14   Layla MawKristen N Ward, DO  HYDROcodone-acetaminophen  (NORCO/VICODIN) 5-325 MG tablet Take 2 tablets by mouth every 4 (four) hours as needed. 06/06/16   Elson AreasLeslie K Leliana Kontz, PA-C  ibuprofen (ADVIL,MOTRIN) 200 MG tablet Take 400 mg by mouth every 6 (six) hours as needed for mild pain.    Historical Provider, MD  indomethacin (INDOCIN) 25 MG capsule Take 1 capsule (25 mg total) by mouth 3 (three) times daily as needed. 06/06/16   Elson AreasLeslie K Rada Zegers, PA-C  lisinopril (PRINIVIL,ZESTRIL) 30 MG tablet Take 30 mg by mouth daily. 07/02/14   Historical Provider, MD  metoprolol (LOPRESSOR) 50 MG tablet Take 50 mg by mouth 2 (two) times daily.    Historical Provider, MD  NIFEdipine (PROCARDIA XL/ADALAT-CC) 90 MG 24 hr tablet Take 90 mg by mouth daily.    Historical Provider, MD  oxyCODONE-acetaminophen (PERCOCET/ROXICET) 5-325 MG tablet Take 1 tablet by mouth every 6 (six) hours as needed. 10/28/15   Ivery QualeHobson Bryant, PA-C  pantoprazole (PROTONIX) 20 MG tablet Take 2 tablets (40 mg total) by mouth daily. 10/28/15   Ivery QualeHobson Bryant, PA-C  predniSONE (DELTASONE) 10 MG tablet 6,5,4,3,2,1 taper 06/06/16   Elson AreasLeslie K Rilyn Upshaw, PA-C  ranitidine (ZANTAC) 150 MG tablet Take 1 tablet (150 mg total) by mouth 2 (two) times daily. 10/28/15   Ivery QualeHobson Bryant, PA-C    Family History Family History  Problem  Relation Age of Onset  . Diabetes Mother   . Hypertension Mother   . Diabetes Father     Social History Social History  Substance Use Topics  . Smoking status: Passive Smoke Exposure - Never Smoker    Types: Cigarettes  . Smokeless tobacco: Never Used  . Alcohol use Yes     Comment: occasional     Allergies   Hctz [hydrochlorothiazide]   Review of Systems Review of Systems  All other systems reviewed and are negative.    Physical Exam Updated Vital Signs BP 154/66 (BP Location: Left Arm)   Pulse 82   Temp 98.4 F (36.9 C) (Oral)   Resp 18   Ht 5\' 7"  (1.702 m)   Wt (!) 145.2 kg   SpO2 95%   BMI 50.12 kg/m   Physical Exam  Constitutional: She appears well-developed  and well-nourished.  Musculoskeletal: She exhibits edema and tenderness.  Swollen warm hand,  nv and ns intact  Neurological: She is alert.  Skin: Skin is warm.  Psychiatric: She has a normal mood and affect.  Nursing note and vitals reviewed.    ED Treatments / Results  Labs (all labs ordered are listed, but only abnormal results are displayed) Labs Reviewed - No data to display  EKG  EKG Interpretation None       Radiology Dg Hand Complete Left  Result Date: 06/06/2016 CLINICAL DATA:  LEFT hand pain and swelling for 1 week without injury, history gout, hypertension EXAM: LEFT HAND - COMPLETE 3+ VIEW COMPARISON:  None FINDINGS: Diffuse soft tissue swelling. Degenerative changes at IP joint thumb with joint space narrowing and spur formation. Joint spaces otherwise fairly well preserved. Osseous mineralization grossly normal. No acute fracture dislocation. Cystic changes versus erosions at the capitate, cannot exclude gout at the mid carpal joint. IMPRESSION: Mild cystic versus erosive changes at the capitate, cannot exclude gout arthropathy of the mid carpal joint. Degenerative changes of the IP joint LEFT thumb. Electronically Signed   By: Ulyses SouthwardMark  Boles M.D.   On: 06/06/2016 12:41    Procedures Procedures (including critical care time)  Medications Ordered in ED Medications - No data to display   Initial Impression / Assessment and Plan / ED Course  I have reviewed the triage vital signs and the nursing notes.  Pertinent labs & imaging results that were available during my care of the patient were reviewed by me and considered in my medical decision making (see chart for details).  Clinical Course     Pt is out of indocin.  I will treat with prednisone.  Pt given rx for indocin and hydrocodone for pain  Final Clinical Impressions(s) / ED Diagnoses   Final diagnoses:  Acute idiopathic gout of left hand    New Prescriptions New Prescriptions    HYDROCODONE-ACETAMINOPHEN (NORCO/VICODIN) 5-325 MG TABLET    Take 2 tablets by mouth every 4 (four) hours as needed.   INDOMETHACIN (INDOCIN) 25 MG CAPSULE    Take 1 capsule (25 mg total) by mouth 3 (three) times daily as needed.   PREDNISONE (DELTASONE) 10 MG TABLET    6,5,4,3,2,1 taper  An After Visit Summary was printed and given to the patient.   Lonia SkinnerLeslie K Lost Lake WoodsSofia, PA-C 06/06/16 1253    Loren Raceravid Yelverton, MD 06/07/16 [phone]

## 2017-05-20 ENCOUNTER — Emergency Department (HOSPITAL_COMMUNITY)
Admission: EM | Admit: 2017-05-20 | Discharge: 2017-05-20 | Disposition: A | Discharge status: Home / Self Care | Payer: BC Managed Care – PPO | Attending: Emergency Medicine | Admitting: Emergency Medicine

## 2017-05-20 ENCOUNTER — Emergency Department (HOSPITAL_COMMUNITY): Payer: BC Managed Care – PPO

## 2017-05-20 ENCOUNTER — Encounter (HOSPITAL_COMMUNITY): Payer: Self-pay | Admitting: Emergency Medicine

## 2017-05-20 DIAGNOSIS — M109 Gout, unspecified: Secondary | ICD-10-CM | POA: Insufficient documentation

## 2017-05-20 DIAGNOSIS — Z79899 Other long term (current) drug therapy: Secondary | ICD-10-CM | POA: Insufficient documentation

## 2017-05-20 DIAGNOSIS — I10 Essential (primary) hypertension: Secondary | ICD-10-CM | POA: Insufficient documentation

## 2017-05-20 DIAGNOSIS — J449 Chronic obstructive pulmonary disease, unspecified: Secondary | ICD-10-CM | POA: Diagnosis not present

## 2017-05-20 DIAGNOSIS — Z7982 Long term (current) use of aspirin: Secondary | ICD-10-CM | POA: Diagnosis not present

## 2017-05-20 DIAGNOSIS — F17210 Nicotine dependence, cigarettes, uncomplicated: Secondary | ICD-10-CM | POA: Insufficient documentation

## 2017-05-20 DIAGNOSIS — M79671 Pain in right foot: Secondary | ICD-10-CM | POA: Diagnosis present

## 2017-05-20 LAB — POCT I-STAT, CHEM 8
BUN: 21 mg/dL — AB (ref 6–20)
CALCIUM ION: 1.13 mmol/L — AB (ref 1.15–1.40)
CHLORIDE: 105 mmol/L (ref 101–111)
Creatinine, Ser: 1 mg/dL (ref 0.44–1.00)
Glucose, Bld: 88 mg/dL (ref 65–99)
HEMATOCRIT: 44 % (ref 36.0–46.0)
Hemoglobin: 15 g/dL (ref 12.0–15.0)
Potassium: 4.3 mmol/L (ref 3.5–5.1)
SODIUM: 140 mmol/L (ref 135–145)
TCO2: 26 mmol/L (ref 22–32)

## 2017-05-20 LAB — URIC ACID: URIC ACID, SERUM: 9.1 mg/dL — AB (ref 2.3–6.6)

## 2017-05-20 MED ORDER — HYDROCODONE-ACETAMINOPHEN 5-325 MG PO TABS
1.0000 | ORAL_TABLET | Freq: Once | ORAL | Status: AC
  Administered 2017-05-20: 1 via ORAL
  Filled 2017-05-20: qty 1

## 2017-05-20 MED ORDER — HYDROCODONE-ACETAMINOPHEN 5-325 MG PO TABS: 1.0000 | ORAL_TABLET | Freq: Four times a day (QID) | ORAL | 0 refills | Status: DC | PRN

## 2017-05-20 MED ORDER — COLCHICINE 0.6 MG PO TABS: 0.6000 mg | ORAL_TABLET | Freq: Every day | ORAL | 0 refills | Status: DC

## 2017-05-20 NOTE — ED Provider Notes (Signed)
[facility] EMERGENCY DEPARTMENT Provider Note   CSN: [id number] Arrival date & time: 05/20/17  1052     History   Chief Complaint Chief Complaint  Patient presents with  . Ankle Pain    HPI [name] is a 56 y.o. female who presents with 3 weeks of left foot/ankle pain. Patient that pain has persisted and progressively worsened. Patient states that she was scheduled for a follow-up appointment with her primary care doctor but states that pain was too severe to wait. Patient denies any preceding trauma, injury, fall prior to onset of symptoms. She states that she's been taking Naprosyn and indomethacin with minimal relief in pain. Patient states that she still been able to ambulate but reports that her pain is worsened with ambulation. Patient has a history of gout and states that she has had episodes of gout in her foot before. Patient reports the current symptoms are similar to previous episodes of gout. Patient states that she has noticed some warmth to the foot but denies any redness. Patient denies any redness/swelling of the distal lower extremity. Patient denies any fever, chest pain, difficulty breathing, numbness, weakness.  The history is provided by the patient.    Past Medical History:  Diagnosis Date  . Gout   . Hypertension     Patient Active Problem List   Diagnosis Date Noted  . COPD exacerbation (HCC) 04/03/2012  . Bronchitis 04/03/2012  . Tobacco abuse 04/03/2012  . Hypertension 04/03/2012  . Hyperglycemia 04/03/2012  . Obesity 04/03/2012    Past Surgical History:  Procedure Laterality Date  . ABDOMINAL HYSTERECTOMY      OB History    Gravida Para Term Preterm AB Living   3 1   1 2 1    SAB TAB Ectopic Multiple Live Births   2               Home Medications    Prior to Admission medications   Medication Sig Start Date End Date Taking? Authorizing Provider  albuterol-ipratropium (COMBIVENT) 18-103 MCG/ACT inhaler Inhale 2 puffs into the  lungs every 4 (four) hours as needed for wheezing or shortness of breath. Patient not taking: Reported on 08/03/2014 04/03/12 04/03/13  Christiane Ha, MD  aspirin EC 81 MG tablet Take 81 mg by mouth daily.    [provider]  azithromycin (ZITHROMAX) 250 MG tablet Take 1 tablet (250 mg total) by mouth daily. Patient not taking: Reported on 08/03/2014 04/03/12   Christiane Ha, MD  colchicine 0.6 MG tablet Take 1 tablet (0.6 mg total) by mouth daily. 05/20/17   Maxwell Caul, PA-C  HYDROcodone-acetaminophen (NORCO/VICODIN) 5-325 MG tablet Take 1-2 tablets by mouth every 6 (six) hours as needed. 05/20/17   Maxwell Caul, PA-C  ibuprofen (ADVIL,MOTRIN) 200 MG tablet Take 400 mg by mouth every 6 (six) hours as needed for mild pain.    [provider]  indomethacin (INDOCIN) 25 MG capsule Take 1 capsule (25 mg total) by mouth 3 (three) times daily as needed. 06/06/16   Elson Areas, PA-C  lisinopril (PRINIVIL,ZESTRIL) 30 MG tablet Take 30 mg by mouth daily. 07/02/14   [provider]  metoprolol (LOPRESSOR) 50 MG tablet Take 50 mg by mouth 2 (two) times daily.    [provider]  NIFEdipine (PROCARDIA XL/ADALAT-CC) 90 MG 24 hr tablet Take 90 mg by mouth daily.    [provider]  oxyCODONE-acetaminophen (PERCOCET/ROXICET) 5-325 MG tablet Take 1 tablet by mouth every  6 (six) hours as needed. 10/28/15   Ivery QualeBryant, Hobson, PA-C  pantoprazole (PROTONIX) 20 MG tablet Take 2 tablets (40 mg total) by mouth daily. 10/28/15   Ivery QualeBryant, Hobson, PA-C  predniSONE (DELTASONE) 10 MG tablet 6,5,4,3,2,1 taper 06/06/16   Elson AreasSofia, Leslie K, PA-C  ranitidine (ZANTAC) 150 MG tablet Take 1 tablet (150 mg total) by mouth 2 (two) times daily. 10/28/15   Ivery QualeBryant, Hobson, PA-C    Family History Family History  Problem Relation Age of Onset  . Diabetes Mother   . Hypertension Mother   . Diabetes Father     Social History Social History  Substance Use Topics  . Smoking status:  Current Every Day Smoker    Types: Cigarettes  . Smokeless tobacco: Never Used  . Alcohol use Yes     Comment: occasional     Allergies   Hctz [hydrochlorothiazide]   Review of Systems Review of Systems  Constitutional: Negative for fever.  Musculoskeletal:       Foot pain  Skin: Negative for color change.  Neurological: Negative for weakness and numbness.     Physical Exam Updated Vital Signs BP (!) 152/83 (BP Location: Right Arm)   Pulse 70   Temp 98.6 F (37 C) (Oral)   Resp 18   Ht 5\' 6"  (1.676 m)   Wt (!) 145.2 kg (320 lb)   SpO2 98%   BMI 51.65 kg/m   Physical Exam  Constitutional: She appears well-developed and well-nourished.  HENT:  Head: Normocephalic and atraumatic.  Eyes: Conjunctivae and EOM are normal. Right eye exhibits no discharge. Left eye exhibits no discharge. No scleral icterus.  Cardiovascular:  Pulses:      Dorsalis pedis pulses are 2+ on the right side, and 2+ on the left side.  Pulmonary/Chest: Effort normal.  Musculoskeletal:  Tenderness palpation in the medial aspect of the left foot that extends slightly over to the dorsal aspect of the foot with mild overlying soft tissue swelling. Area is slightly warm but no erythema. No deformity or crepitus noted. No tender palpation to the lateral malleolus. Dorsiflexion and plantar flexion of left ankle intact without difficulty. Patient able to move all 5 toes of the left foot without difficulty. No tenderness palpation of the distal tib-fib, left knee. Bilateral lower extremities are symmetric in appearance. No abnormalities of the right lower extremity.  Neurological: She is alert.  Sensation intact along major nerve distributions of the bilateral feet  Skin: Skin is warm and dry. Capillary refill takes less than 2 seconds.  Good distal cap refill  Psychiatric: She has a normal mood and affect. Her speech is normal and behavior is normal.  Nursing note and vitals reviewed.    ED Treatments /  Results  Labs (all labs ordered are listed, but only abnormal results are displayed) Labs Reviewed  URIC ACID - Abnormal; Notable for the following:       Result Value   Uric Acid, Serum 9.1 (*)    All other components within normal limits  POCT I-STAT, CHEM 8 - Abnormal; Notable for the following:    BUN 21 (*)    Calcium, Ion 1.13 (*)    All other components within normal limits  I-STAT CHEM 8, ED    EKG  EKG Interpretation None       Radiology Dg Ankle Complete Left  Result Date: 05/20/2017 CLINICAL DATA:  Left ankle pain.  Swelling.  No known injury . EXAM: LEFT ANKLE COMPLETE - 3+ VIEW COMPARISON:  No recent prior . FINDINGS: Diffuse soft tissue swelling. Diffuse degenerative change. No evidence of fracture or dislocation. No acute bony abnormality identified. IMPRESSION: Diffuse soft tissue swelling. Diffuse degenerative change. No acute bony or joint abnormality identified. Electronically Signed   By: Maisie Fus  Register   On: 05/20/2017 12:05    Procedures Procedures (including critical care time)  Medications Ordered in ED Medications  HYDROcodone-acetaminophen (NORCO/VICODIN) 5-325 MG per tablet 1 tablet (1 tablet Oral Given 05/20/17 1337)     Initial Impression / Assessment and Plan / ED Course  I have reviewed the triage vital signs and the nursing notes.  Pertinent labs & imaging results that were available during my care of the patient were reviewed by me and considered in my medical decision making (see chart for details).     56 year old female who presents with 3 weeks of persistent left ankle/foot pain. History of gout and states that current symptoms feel similar. Patient is afebrile, non-toxic appearing, sitting comfortably on examination table. Vital signs reviewed. Patient is slightly hypertensive. The likely secondary to pain. Consider gout versus fracture versus dislocation versus sprain, the low suspicion given lack of history of trauma or fall.  History/physical exam are not concerning for DVT or septic arthritis. Initial x-rays and uric acid level ordered at triage. Patient has been" seen before for treatment of gout. She states that she stopped because it was not affordable. She is currently on indomethacin but states that she does not have relief from taking that medication. Will plan add on an I-STAT Chem-8 for evaluation of her kidney function. Analgesics provided in the department.  X-rays reviewed. Diffuse soft tissue swelling noted but otherwise no fracture or dislocation. Uric acid level is slightly elevated. I-STAT Chem-8 shows BUN/creatinine 18 within normal limits. Will plan to restart patient on colchicine. We'll plan to give a very short course of pain medication to help with symptomatically relief. Instructed patient to follow-up with her primary care doctor for further evaluation. Provided patient with a list of clinic resources to use if he does not have a PCP. Instructed to call them today to arrange follow-up in the next 24-48 hours. Strict return precautions discussed. Patient expresses understanding and agreement to plan.   Final Clinical Impressions(s) / ED Diagnoses   Final diagnoses:  Gout of right foot, unspecified cause, unspecified chronicity    New Prescriptions New Prescriptions   COLCHICINE 0.6 MG TABLET    Take 1 tablet (0.6 mg total) by mouth daily.   HYDROCODONE-ACETAMINOPHEN (NORCO/VICODIN) 5-325 MG TABLET    Take 1-2 tablets by mouth every 6 (six) hours as needed.     Maxwell Caul, PA-C 05/20/17 1412    Doug Sou, MD 05/20/17 1626

## 2017-05-20 NOTE — ED Triage Notes (Signed)
Patient complaining of left ankle pain x 2 weeks. Denies injury. States she has history of gout.

## 2017-05-20 NOTE — Discharge Instructions (Signed)
Take Colchicine as directed.   You can take Tylenol or Ibuprofen for pain. You can take the pain medication for severe or breakthrough pain. Do not take and associates have Tylenol or ibuprofen.  Follow-up with her primary care doctor in the next 24-48 hours for further evaluation.  Return the emergency department for any worsening pain, redness/swelling that extends up the leg, warmth to the foot, fevers or any other worsening or concerning symptoms.

## 2018-12-07 ENCOUNTER — Encounter: Payer: Self-pay | Admitting: Internal Medicine

## 2020-09-21 ENCOUNTER — Inpatient Hospital Stay (HOSPITAL_COMMUNITY)
Admission: EM | Admit: 2020-09-21 | Discharge: 2020-09-26 | DRG: 552 | Disposition: A | Discharge status: Home Health | Payer: BC Managed Care – PPO | Attending: Internal Medicine | Admitting: Internal Medicine

## 2020-09-21 ENCOUNTER — Encounter (HOSPITAL_COMMUNITY): Payer: Self-pay | Admitting: Emergency Medicine

## 2020-09-21 ENCOUNTER — Other Ambulatory Visit: Payer: Self-pay

## 2020-09-21 ENCOUNTER — Emergency Department (HOSPITAL_COMMUNITY): Payer: BC Managed Care – PPO

## 2020-09-21 DIAGNOSIS — Z6841 Body Mass Index (BMI) 40.0 and over, adult: Secondary | ICD-10-CM

## 2020-09-21 DIAGNOSIS — M1A9XX0 Chronic gout, unspecified, without tophus (tophi): Secondary | ICD-10-CM

## 2020-09-21 DIAGNOSIS — M48061 Spinal stenosis, lumbar region without neurogenic claudication: Principal | ICD-10-CM | POA: Diagnosis present

## 2020-09-21 DIAGNOSIS — M549 Dorsalgia, unspecified: Secondary | ICD-10-CM

## 2020-09-21 DIAGNOSIS — N1831 Chronic kidney disease, stage 3a: Secondary | ICD-10-CM | POA: Diagnosis present

## 2020-09-21 DIAGNOSIS — M4804 Spinal stenosis, thoracic region: Secondary | ICD-10-CM | POA: Diagnosis present

## 2020-09-21 DIAGNOSIS — I10 Essential (primary) hypertension: Secondary | ICD-10-CM

## 2020-09-21 DIAGNOSIS — R5381 Other malaise: Secondary | ICD-10-CM

## 2020-09-21 DIAGNOSIS — G8929 Other chronic pain: Secondary | ICD-10-CM | POA: Diagnosis present

## 2020-09-21 DIAGNOSIS — J449 Chronic obstructive pulmonary disease, unspecified: Secondary | ICD-10-CM

## 2020-09-21 DIAGNOSIS — Z90710 Acquired absence of both cervix and uterus: Secondary | ICD-10-CM

## 2020-09-21 DIAGNOSIS — Z833 Family history of diabetes mellitus: Secondary | ICD-10-CM

## 2020-09-21 DIAGNOSIS — M545 Low back pain, unspecified: Secondary | ICD-10-CM | POA: Diagnosis not present

## 2020-09-21 DIAGNOSIS — M5430 Sciatica, unspecified side: Secondary | ICD-10-CM | POA: Diagnosis present

## 2020-09-21 DIAGNOSIS — F17210 Nicotine dependence, cigarettes, uncomplicated: Secondary | ICD-10-CM | POA: Diagnosis present

## 2020-09-21 DIAGNOSIS — Z7982 Long term (current) use of aspirin: Secondary | ICD-10-CM

## 2020-09-21 DIAGNOSIS — Z79899 Other long term (current) drug therapy: Secondary | ICD-10-CM

## 2020-09-21 DIAGNOSIS — D72829 Elevated white blood cell count, unspecified: Secondary | ICD-10-CM | POA: Diagnosis not present

## 2020-09-21 DIAGNOSIS — Z20822 Contact with and (suspected) exposure to covid-19: Secondary | ICD-10-CM | POA: Diagnosis present

## 2020-09-21 DIAGNOSIS — Z888 Allergy status to other drugs, medicaments and biological substances status: Secondary | ICD-10-CM

## 2020-09-21 DIAGNOSIS — K219 Gastro-esophageal reflux disease without esophagitis: Secondary | ICD-10-CM | POA: Diagnosis present

## 2020-09-21 DIAGNOSIS — N179 Acute kidney failure, unspecified: Secondary | ICD-10-CM | POA: Diagnosis present

## 2020-09-21 DIAGNOSIS — Z8249 Family history of ischemic heart disease and other diseases of the circulatory system: Secondary | ICD-10-CM

## 2020-09-21 DIAGNOSIS — Z720 Tobacco use: Secondary | ICD-10-CM

## 2020-09-21 DIAGNOSIS — T380X5A Adverse effect of glucocorticoids and synthetic analogues, initial encounter: Secondary | ICD-10-CM | POA: Diagnosis not present

## 2020-09-21 DIAGNOSIS — M109 Gout, unspecified: Secondary | ICD-10-CM | POA: Diagnosis present

## 2020-09-21 DIAGNOSIS — E669 Obesity, unspecified: Secondary | ICD-10-CM | POA: Diagnosis present

## 2020-09-21 DIAGNOSIS — M4807 Spinal stenosis, lumbosacral region: Secondary | ICD-10-CM | POA: Diagnosis present

## 2020-09-21 DIAGNOSIS — Z23 Encounter for immunization: Secondary | ICD-10-CM

## 2020-09-21 DIAGNOSIS — I129 Hypertensive chronic kidney disease with stage 1 through stage 4 chronic kidney disease, or unspecified chronic kidney disease: Secondary | ICD-10-CM | POA: Diagnosis present

## 2020-09-21 DIAGNOSIS — R739 Hyperglycemia, unspecified: Secondary | ICD-10-CM | POA: Diagnosis present

## 2020-09-21 LAB — CBC WITH DIFFERENTIAL/PLATELET
Abs Immature Granulocytes: 0.87 10*3/uL — ABNORMAL HIGH (ref 0.00–0.07)
Basophils Absolute: 0.1 10*3/uL (ref 0.0–0.1)
Basophils Relative: 0 %
Eosinophils Absolute: 0.3 10*3/uL (ref 0.0–0.5)
Eosinophils Relative: 1 %
HCT: 40.6 % (ref 36.0–46.0)
Hemoglobin: 13.1 g/dL (ref 12.0–15.0)
Immature Granulocytes: 4 %
Lymphocytes Relative: 6 %
Lymphs Abs: 1.3 10*3/uL (ref 0.7–4.0)
MCH: 30 pg (ref 26.0–34.0)
MCHC: 32.3 g/dL (ref 30.0–36.0)
MCV: 92.9 fL (ref 80.0–100.0)
Monocytes Absolute: 1.5 10*3/uL — ABNORMAL HIGH (ref 0.1–1.0)
Monocytes Relative: 6 %
Neutro Abs: 19.3 10*3/uL — ABNORMAL HIGH (ref 1.7–7.7)
Neutrophils Relative %: 83 %
Platelets: 565 10*3/uL — ABNORMAL HIGH (ref 150–400)
RBC: 4.37 MIL/uL (ref 3.87–5.11)
RDW: 14.7 % (ref 11.5–15.5)
WBC: 23.3 10*3/uL — ABNORMAL HIGH (ref 4.0–10.5)
nRBC: 0 % (ref 0.0–0.2)

## 2020-09-21 LAB — COMPREHENSIVE METABOLIC PANEL
ALT: 30 U/L (ref 0–44)
AST: 38 U/L (ref 15–41)
Albumin: 2.8 g/dL — ABNORMAL LOW (ref 3.5–5.0)
Alkaline Phosphatase: 225 U/L — ABNORMAL HIGH (ref 38–126)
Anion gap: 13 (ref 5–15)
BUN: 57 mg/dL — ABNORMAL HIGH (ref 8–23)
CO2: 21 mmol/L — ABNORMAL LOW (ref 22–32)
Calcium: 10.3 mg/dL (ref 8.9–10.3)
Chloride: 100 mmol/L (ref 98–111)
Creatinine, Ser: 1.59 mg/dL — ABNORMAL HIGH (ref 0.44–1.00)
GFR, Estimated: 37 mL/min — ABNORMAL LOW (ref >60)
Glucose, Bld: 119 mg/dL — ABNORMAL HIGH (ref 70–99)
Potassium: 3.6 mmol/L (ref 3.5–5.1)
Sodium: 134 mmol/L — ABNORMAL LOW (ref 135–145)
Total Bilirubin: 2.4 mg/dL — ABNORMAL HIGH (ref 0.3–1.2)
Total Protein: 7.9 g/dL (ref 6.5–8.1)

## 2020-09-21 LAB — URINALYSIS, ROUTINE W REFLEX MICROSCOPIC
Bilirubin Urine: NEGATIVE
Glucose, UA: NEGATIVE mg/dL
Ketones, ur: NEGATIVE mg/dL
Nitrite: NEGATIVE
Protein, ur: 30 mg/dL — AB
Specific Gravity, Urine: 1.016 (ref 1.005–1.030)
pH: 5 (ref 5.0–8.0)

## 2020-09-21 LAB — VITAMIN B12: Vitamin B-12: 305 pg/mL (ref 180–914)

## 2020-09-21 LAB — MAGNESIUM: Magnesium: 2.3 mg/dL (ref 1.7–2.4)

## 2020-09-21 LAB — PHOSPHORUS: Phosphorus: 4.7 mg/dL — ABNORMAL HIGH (ref 2.5–4.6)

## 2020-09-21 LAB — TSH: TSH: 0.814 u[IU]/mL (ref 0.350–4.500)

## 2020-09-21 LAB — VITAMIN D 25 HYDROXY (VIT D DEFICIENCY, FRACTURES): Vit D, 25-Hydroxy: 45.48 ng/mL (ref 30–100)

## 2020-09-21 LAB — URIC ACID: Uric Acid, Serum: 13.1 mg/dL — ABNORMAL HIGH (ref 2.5–7.1)

## 2020-09-21 MED ORDER — NICOTINE 14 MG/24HR TD PT24
14.0000 mg | MEDICATED_PATCH | Freq: Every day | TRANSDERMAL | Status: DC
  Administered 2020-09-21 – 2020-09-26 (×6): 14 mg via TRANSDERMAL
  Filled 2020-09-21 (×7): qty 1

## 2020-09-21 MED ORDER — ALBUTEROL SULFATE HFA 108 (90 BASE) MCG/ACT IN AERS: 2.0000 | INHALATION_SPRAY | RESPIRATORY_TRACT | Status: DC | PRN

## 2020-09-21 MED ORDER — SODIUM CHLORIDE 0.9 % IV SOLN: Freq: Once | INTRAVENOUS | Status: DC

## 2020-09-21 MED ORDER — ONDANSETRON HCL 4 MG PO TABS: 4.0000 mg | ORAL_TABLET | Freq: Four times a day (QID) | ORAL | Status: DC | PRN

## 2020-09-21 MED ORDER — INFLUENZA VAC SPLIT QUAD 0.5 ML IM SUSY
0.5000 mL | PREFILLED_SYRINGE | INTRAMUSCULAR | Status: AC
  Administered 2020-09-22: 0.5 mL via INTRAMUSCULAR
  Filled 2020-09-21: qty 0.5

## 2020-09-21 MED ORDER — METHOCARBAMOL 1000 MG/10ML IJ SOLN
500.0000 mg | Freq: Three times a day (TID) | INTRAVENOUS | Status: DC | PRN
  Filled 2020-09-21: qty 5

## 2020-09-21 MED ORDER — IPRATROPIUM-ALBUTEROL 18-103 MCG/ACT IN AERO: 2.0000 | INHALATION_SPRAY | RESPIRATORY_TRACT | Status: DC | PRN

## 2020-09-21 MED ORDER — MORPHINE SULFATE (PF) 4 MG/ML IV SOLN
4.0000 mg | Freq: Once | INTRAVENOUS | Status: AC
  Administered 2020-09-21: 4 mg via INTRAVENOUS
  Filled 2020-09-21: qty 1

## 2020-09-21 MED ORDER — GABAPENTIN 100 MG PO CAPS
100.0000 mg | ORAL_CAPSULE | Freq: Two times a day (BID) | ORAL | Status: DC
  Administered 2020-09-21 – 2020-09-26 (×10): 100 mg via ORAL
  Filled 2020-09-21 (×10): qty 1

## 2020-09-21 MED ORDER — HEPARIN SODIUM (PORCINE) 5000 UNIT/ML IJ SOLN
5000.0000 [IU] | Freq: Three times a day (TID) | INTRAMUSCULAR | Status: DC
  Administered 2020-09-21 – 2020-09-26 (×15): 5000 [IU] via SUBCUTANEOUS
  Filled 2020-09-21 (×14): qty 1

## 2020-09-21 MED ORDER — METHYLPREDNISOLONE SODIUM SUCC 40 MG IJ SOLR
40.0000 mg | Freq: Two times a day (BID) | INTRAMUSCULAR | Status: DC
  Administered 2020-09-21 – 2020-09-26 (×11): 40 mg via INTRAVENOUS
  Filled 2020-09-21 (×11): qty 1

## 2020-09-21 MED ORDER — METOPROLOL TARTRATE 25 MG PO TABS
25.0000 mg | ORAL_TABLET | Freq: Two times a day (BID) | ORAL | Status: DC
  Administered 2020-09-21 – 2020-09-26 (×10): 25 mg via ORAL
  Filled 2020-09-21 (×10): qty 1

## 2020-09-21 MED ORDER — PANTOPRAZOLE SODIUM 40 MG PO TBEC
40.0000 mg | DELAYED_RELEASE_TABLET | Freq: Every day | ORAL | Status: DC
  Administered 2020-09-21 – 2020-09-26 (×6): 40 mg via ORAL
  Filled 2020-09-21 (×6): qty 1

## 2020-09-21 MED ORDER — SODIUM CHLORIDE 0.9 % IV SOLN: Freq: Once | INTRAVENOUS | Status: AC

## 2020-09-21 MED ORDER — ASPIRIN EC 81 MG PO TBEC
81.0000 mg | DELAYED_RELEASE_TABLET | Freq: Every day | ORAL | Status: DC
  Administered 2020-09-21 – 2020-09-26 (×6): 81 mg via ORAL
  Filled 2020-09-21 (×6): qty 1

## 2020-09-21 MED ORDER — IPRATROPIUM BROMIDE HFA 17 MCG/ACT IN AERS
2.0000 | INHALATION_SPRAY | RESPIRATORY_TRACT | Status: DC | PRN
  Filled 2020-09-21: qty 12.9

## 2020-09-21 MED ORDER — NIFEDIPINE ER OSMOTIC RELEASE 30 MG PO TB24
90.0000 mg | ORAL_TABLET | Freq: Every day | ORAL | Status: DC
  Administered 2020-09-21 – 2020-09-26 (×6): 90 mg via ORAL
  Filled 2020-09-21 (×6): qty 3

## 2020-09-21 MED ORDER — HYDROMORPHONE HCL 1 MG/ML IJ SOLN
1.0000 mg | INTRAMUSCULAR | Status: DC | PRN
  Administered 2020-09-22 – 2020-09-26 (×8): 1 mg via INTRAVENOUS
  Filled 2020-09-21 (×9): qty 1

## 2020-09-21 MED ORDER — ONDANSETRON HCL 4 MG/2ML IJ SOLN: 4.0000 mg | Freq: Four times a day (QID) | INTRAMUSCULAR | Status: DC | PRN

## 2020-09-21 NOTE — Progress Notes (Signed)
   09/21/20 1730  Assess: MEWS Score  Temp 98 F (36.7 C)  BP (!) 152/77  Resp 20  SpO2 97 %  O2 Device Room Air  Assess: MEWS Score  MEWS Temp 0  MEWS Systolic 0  MEWS Pulse 2  MEWS RR 0  MEWS LOC 0  MEWS Score 2  MEWS Score Color Yellow  Assess: if the MEWS score is Yellow or Red  Were vital signs taken at a resting state? Yes  Focused Assessment No change from prior assessment  Early Detection of Sepsis Score *See Row Information* Low  MEWS guidelines implemented *See Row Information* Yes  Treat  MEWS Interventions Administered scheduled meds/treatments  Pain Scale 0-10  Pain Score 3  Pain Type Chronic pain  Pain Location Back  Take Vital Signs  Increase Vital Sign Frequency  Yellow: Q 2hr X 2 then Q 4hr X 2, if remains yellow, continue Q 4hrs  Escalate  MEWS: Escalate Yellow: discuss with charge nurse/RN and consider discussing with provider and RRT  Notify: Charge Nurse/RN  Name of Charge Nurse/RN Notified Tim Goins  Date Charge Nurse/RN Notified 09/21/20  Time Charge Nurse/RN Notified 1755

## 2020-09-21 NOTE — ED Provider Notes (Addendum)
[facility] EMERGENCY DEPARTMENT Provider Note   CSN: [id number] Arrival date & time: 09/21/20  0945     History Chief Complaint  Patient presents with  . Back Pain    [name] is a 60 y.o. female.  HPI  60 year old female with past medical history of COPD, HTN, obesity presents to the emergency department concern for lower back pain.  Patient states that she has chronic lower back pain/spasms.  She is been taking her home medication without significant relief.  She feels like the back spasms have been more severe over the past 2 to 3 weeks.  Over the past couple days she states she has been too scared to try to get out of her chair because when she does "her legs do not work".  She states it is too painful to try to stand and she is unable to ambulate even with assistance.  She has not been able to walk to the bathroom and has been using briefs but denies any stool or urinary incontinence.  She denies any numbness of the lower extremities but admits that they feel weak bilaterally and are chronically large/edematous.  Patient denies any acute injury to the lower back.  Denies any flank/abdominal pain.  No other recent illness including fever, chest pain, shortness of breath.  Past Medical History:  Diagnosis Date  . Gout   . Hypertension     Patient Active Problem List   Diagnosis Date Noted  . COPD exacerbation (HCC) 04/03/2012  . Bronchitis 04/03/2012  . Tobacco abuse 04/03/2012  . Hypertension 04/03/2012  . Hyperglycemia 04/03/2012  . Obesity 04/03/2012    Past Surgical History:  Procedure Laterality Date  . ABDOMINAL HYSTERECTOMY       OB History    Gravida  3   Para  1   Term      Preterm  1   AB  2   Living  1     SAB  2   IAB      Ectopic      Multiple      Live Births              Family History  Problem Relation Age of Onset  . Diabetes Mother   . Hypertension Mother   . Diabetes Father     Social History   Tobacco Use  .  Smoking status: Current Every Day Smoker    Types: Cigarettes  . Smokeless tobacco: Never Used  Substance Use Topics  . Alcohol use: Yes    Comment: occasional  . Drug use: Yes    Types: Marijuana    Home Medications Prior to Admission medications   Medication Sig Start Date End Date Taking? Authorizing Provider  albuterol-ipratropium (COMBIVENT) 18-103 MCG/ACT inhaler Inhale 2 puffs into the lungs every 4 (four) hours as needed for wheezing or shortness of breath. Patient not taking: Reported on 08/03/2014 04/03/12 04/03/13  Christiane Ha, MD  aspirin EC 81 MG tablet Take 81 mg by mouth daily.    [provider]  azithromycin (ZITHROMAX) 250 MG tablet Take 1 tablet (250 mg total) by mouth daily. Patient not taking: Reported on 08/03/2014 04/03/12   Christiane Ha, MD  colchicine 0.6 MG tablet Take 1 tablet (0.6 mg total) by mouth daily. 05/20/17   Maxwell Caul, PA-C  HYDROcodone-acetaminophen (NORCO/VICODIN) 5-325 MG tablet Take 1-2 tablets by mouth every 6 (six) hours as needed. 05/20/17   Maxwell Caul, PA-C  ibuprofen (ADVIL,MOTRIN) 200 MG tablet Take 400 mg by mouth every 6 (six) hours as needed for mild pain.    [provider]  indomethacin (INDOCIN) 25 MG capsule Take 1 capsule (25 mg total) by mouth 3 (three) times daily as needed. 06/06/16   Elson Areas, PA-C  lisinopril (PRINIVIL,ZESTRIL) 30 MG tablet Take 30 mg by mouth daily. 07/02/14   [provider]  metoprolol (LOPRESSOR) 50 MG tablet Take 50 mg by mouth 2 (two) times daily.    [provider]  NIFEdipine (PROCARDIA XL/ADALAT-CC) 90 MG 24 hr tablet Take 90 mg by mouth daily.    [provider]  oxyCODONE-acetaminophen (PERCOCET/ROXICET) 5-325 MG tablet Take 1 tablet by mouth every 6 (six) hours as needed. 10/28/15   Ivery Quale, PA-C  pantoprazole (PROTONIX) 20 MG tablet Take 2 tablets (40 mg total) by mouth daily. 10/28/15   Ivery Quale, PA-C  predniSONE  (DELTASONE) 10 MG tablet 6,5,4,3,2,1 taper 06/06/16   Elson Areas, PA-C  ranitidine (ZANTAC) 150 MG tablet Take 1 tablet (150 mg total) by mouth 2 (two) times daily. 10/28/15   Ivery Quale, PA-C    Allergies    Hctz [hydrochlorothiazide]  Review of Systems   Review of Systems  Constitutional: Negative for chills and fever.  HENT: Negative for congestion.   Eyes: Negative for visual disturbance.  Respiratory: Negative for shortness of breath.   Cardiovascular: Negative for chest pain.  Gastrointestinal: Negative for abdominal pain, diarrhea and vomiting.       No stool incontinence   Genitourinary: Negative for dysuria.       No incontinence  Musculoskeletal: Positive for back pain. Negative for neck pain and neck stiffness.  Skin: Negative for rash.  Neurological: Positive for weakness. Negative for numbness and headaches.    Physical Exam Updated Vital Signs BP (!) 158/113 (BP Location: Right Arm)   Pulse (!) 125   Temp 98.7 F (37.1 C) (Oral)   Resp 19   Ht 5\' 5"  (1.651 m)   Wt (!) 154.2 kg   SpO2 96%   BMI 56.58 kg/m   Physical Exam Vitals and nursing note reviewed.  Constitutional:      Appearance: Normal appearance. She is obese. She is not toxic-appearing.  HENT:     Head: Normocephalic.     Mouth/Throat:     Mouth: Mucous membranes are moist.  Cardiovascular:     Rate and Rhythm: Normal rate.  Pulmonary:     Effort: Pulmonary effort is normal. No respiratory distress.  Abdominal:     General: There is no distension.     Palpations: Abdomen is soft. There is no mass.     Tenderness: There is no abdominal tenderness. There is no guarding or rebound.  Musculoskeletal:     Comments: Chronically large and edematous BLE, patient is able to move her feet on command, she has intact sensation in all dermatomes, unable to fully evaluate reflexes given her body habitus  Skin:    General: Skin is warm.  Neurological:     Mental Status: She is alert and  oriented to person, place, and time. Mental status is at baseline.     Comments: Appears to be neurologically intact in the bilateral lower extremities, no saddle anesthesia  Psychiatric:        Mood and Affect: Mood normal.     ED Results / Procedures / Treatments   Labs (all labs ordered are listed, but only abnormal results are displayed) Labs  Reviewed  CBC WITH DIFFERENTIAL/PLATELET - Abnormal; Notable for the following components:      Result Value   WBC 23.3 (*)    Platelets 565 (*)    Neutro Abs 19.3 (*)    Monocytes Absolute 1.5 (*)    Abs Immature Granulocytes 0.87 (*)    All other components within normal limits  COMPREHENSIVE METABOLIC PANEL    EKG None  Radiology No results found.  Procedures Procedures   Medications Ordered in ED Medications  morphine 4 MG/ML injection 4 mg (4 mg Intravenous Given 09/21/20 1039)    ED Course  I have reviewed the triage vital signs and the nursing notes.  Pertinent labs & imaging results that were available during my care of the patient were reviewed by me and considered in my medical decision making (see chart for details).    MDM Rules/Calculators/A&P                          60 year old female presents to the emergency department for evaluation of continued lower back pain/spasm.  This is been a chronic issue for the patient but her home remedies are no longer working.  The discomfort has become so severe that she is unsteady and unable to walk due to weakness and pain in her bilateral lower extremities.  She appears neurovascularly intact, she is obese with a very large legs.  I do not appreciate an acute neurologic change.  No saddle anesthesia.  No recent history of fever or infection.  No flank or abdominal pain.  Patient is tender to palpation of the lower lumbar musculature and with movement of the lower legs. I believe this is most likely MSK involving the lumbar.  Abdomen is soft and benign, lower suspicion for  something like a AAA at this time.  Patient had improvement with IV morphine.  Blood work shows an elevated bilirubin 2.4, patient has no right upper quadrant abdominal pain, vomiting or diarrhea.  Unclear the significance.  Patient does admit to decreased p.o. intake since she has been chair bound, she has an acute kidney injury.  Plan to hydrate.  Urinalysis has been ordered, no flank pain or other renal symptoms.  CAT scan was done which shows multilevel spondylosis worse at L4-L5.  Also shows severe bilateral foraminal narrowing at L5-S1 with mild to moderate central canal stenosis and moderately to severe appearing central canal and bilateral foraminal stenosis at T10-T11.  On reevaluation patient feels improved but continues to have significant debility secondary to the lower back pain.  Again no acute neuro findings.  Spoke with on-call neurosurgery Dr. Lovell Sheehan who states patient is not an acute surgical candidate and can be managed as an outpatient.  However secondary to her debility he agrees with admission, advises pain control and physical therapy and they will follow with her as an outpatient.  Patients evaluation and results requires admission for further treatment and care. Hospitalist accepting. Patient agrees with admission plan, offers no new complaints and is stable/unchanged at time of admit.  Final Clinical Impression(s) / ED Diagnoses Final diagnoses:  None    Rx / DC Orders ED Discharge Orders    None       Rozelle Logan, DO 09/21/20 1416    Rozelle Logan, DO 09/21/20 1421

## 2020-09-21 NOTE — H&P (Signed)
History and Physical    [name] IEP:[id number] DOB: 1961/01/26 DOA: 09/21/2020  PCP: [facility]   Patient coming from: Home  I have personally briefly reviewed patient's old medical records in [facility]  Chief Complaint: Low back pain and inability to ambulate.  HPI: [name] is a 60 y.o. female with medical history significant of hypertension, morbid obesity, tobacco abuse, COPD, history of gout and gastroesophageal reflux disease; who is presented to the hospital secondary to worsening lower back pain and inability to walk.  Patient reports back pain has been present for the last 2 to 73-month but in the last 2 weeks has gotten severely worse and is impending here to ambulate.  Patient reports some mild intermittent sciatica symptoms, expressed pain to be dull and constant in the lower aspect of her back.  Physical exertion and ambulation makes it worse.  Patient denies fever, chills, abdominal pain, melena, hematochezia, dysuria, stools or urine incontinence, chest pain, shortness of breath, nausea, vomiting or sick contacts.  She continues to smoke.  ED Course: CT scan of the lower back demonstrating multilevel spondylosis worse at L4-L5, also several bilateral foraminal narrowing at L5-S1 with mild to moderate central canal stenosis.  There is also severe appearing central canal and bilateral foraminal stenosis at T10-T11.  Patient denies red flag symptoms for spinal cord compression and after discussing with neurosurgery on-call (Dr. Lovell Sheehan) recommended conservative management with a steroids, analgesics, physical therapy and outpatient follow-up.  [facility] has been contacted to assist with management.  Review of Systems: As per HPI otherwise all other systems reviewed and are negative.   Past Medical History:  Diagnosis Date  . Gout   . Hypertension     Past Surgical History:  Procedure Laterality Date  . ABDOMINAL HYSTERECTOMY      Social  History  reports that she has been smoking cigarettes. She has never used smokeless tobacco. She reports current alcohol use. She reports current drug use. Drug: Marijuana.  Allergies  Allergen Reactions  . Hctz [Hydrochlorothiazide] Rash    Family History  Problem Relation Age of Onset  . Diabetes Mother   . Hypertension Mother   . Diabetes Father     Prior to Admission medications   Medication Sig Start Date End Date Taking? Authorizing Provider  aspirin EC 81 MG tablet Take 81 mg by mouth daily.   Yes [provider]  DIALYVITE VITAMIN D3 MAX 1.25 MG (50000 UT) TABS Take 1 tablet by mouth once a week. 04/25/20  Yes [provider]  ibuprofen (ADVIL,MOTRIN) 200 MG tablet Take 400 mg by mouth every 6 (six) hours as needed for mild pain.   Yes [provider]  indomethacin (INDOCIN) 25 MG capsule Take 1 capsule (25 mg total) by mouth 3 (three) times daily as needed. 06/06/16  Yes Cheron Schaumann K, PA-C  lisinopril (PRINIVIL,ZESTRIL) 30 MG tablet Take 30 mg by mouth daily. 07/02/14  Yes [provider]  metoprolol (LOPRESSOR) 50 MG tablet Take 50 mg by mouth 2 (two) times daily.   Yes [provider]  NIFEdipine (PROCARDIA XL/ADALAT-CC) 90 MG 24 hr tablet Take 90 mg by mouth daily.   Yes [provider]  albuterol-ipratropium (COMBIVENT) 18-103 MCG/ACT inhaler Inhale 2 puffs into the lungs every 4 (four) hours as needed for wheezing or shortness of breath. Patient not taking: No sig reported 04/03/12 04/03/13  Christiane Ha, MD  colchicine 0.6 MG tablet Take 1 tablet (  0.6 mg total) by mouth daily. Patient not taking: No sig reported 05/20/17   Maxwell Caul, PA-C  HYDROcodone-acetaminophen (NORCO/VICODIN) 5-325 MG tablet Take 1-2 tablets by mouth every 6 (six) hours as needed. Patient not taking: No sig reported 05/20/17   Maxwell Caul, PA-C  oxyCODONE-acetaminophen (PERCOCET/ROXICET) 5-325 MG tablet Take 1 tablet by mouth  every 6 (six) hours as needed. Patient not taking: No sig reported 10/28/15   Ivery Quale, PA-C  pantoprazole (PROTONIX) 20 MG tablet Take 2 tablets (40 mg total) by mouth daily. Patient not taking: No sig reported 10/28/15   Ivery Quale, PA-C    Physical Exam: Vitals:   09/21/20 1500 09/21/20 1530 09/21/20 1600 09/21/20 1630  BP: 126/83 137/76 (!) 146/84 131/80  Pulse:      Resp:      Temp:      TempSrc:      SpO2:      Weight:      Height:        Constitutional: NAD, denies chest pain, nausea, vomiting, dysuria and shortness of breath. Vitals:   09/21/20 1500 09/21/20 1530 09/21/20 1600 09/21/20 1630  BP: 126/83 137/76 (!) 146/84 131/80  Pulse:      Resp:      Temp:      TempSrc:      SpO2:      Weight:      Height:       Eyes: PERRL, lids and conjunctivae normal.  No icterus or nystagmus. ENMT: Mucous membranes are moist. Posterior pharynx clear of any exudate or lesions. Neck: normal, supple, no masses, no thyromegaly Respiratory: clear to auscultation bilaterally, no wheezing, no crackles. Normal respiratory effort. No accessory muscle use.  Good saturation on room air. Cardiovascular: Regular rate and rhythm, no murmurs / rubs / gallops.  No JVD appreciated. Abdomen: Obese, no tenderness, no masses palpated. No hepatosplenomegaly. Bowel sounds positive.  Musculoskeletal: no clubbing / cyanosis. No joint deformity appreciated; chronic lymphedema of both legs up to her thighs seen on exam. Skin: no rashes, no petechiae. Neurologic: CN 2-12 grossly intact. Sensation intact, DTR normal. MS 4/5 in all 4 limbs secondary to poor effort.  Psychiatric: Normal judgment and insight. Alert and oriented x 3. Normal mood.    Labs on Admission: I have personally reviewed following labs and imaging studies  CBC: Recent Labs  Lab 09/21/20 1029  WBC 23.3*  NEUTROABS 19.3*  HGB 13.1  HCT 40.6  MCV 92.9  PLT 565*    Basic Metabolic Panel: Recent Labs  Lab  09/21/20 1029  NA 134*  K 3.6  CL 100  CO2 21*  GLUCOSE 119*  BUN 57*  CREATININE 1.59*  CALCIUM 10.3    GFR: Estimated Creatinine Clearance: 55.5 mL/min (A) (by C-G formula based on SCr of 1.59 mg/dL (H)).  Liver Function Tests: Recent Labs  Lab 09/21/20 1029  AST 38  ALT 30  ALKPHOS 225*  BILITOT 2.4*  PROT 7.9  ALBUMIN 2.8*    Urine analysis:    Component Value Date/Time   COLORURINE YELLOW 10/28/2015 1040   APPEARANCEUR CLEAR 10/28/2015 1040   LABSPEC 1.010 10/28/2015 1040   PHURINE 7.0 10/28/2015 1040   GLUCOSEU NEGATIVE 10/28/2015 1040   HGBUR NEGATIVE 10/28/2015 1040   BILIRUBINUR NEGATIVE 10/28/2015 1040   KETONESUR NEGATIVE 10/28/2015 1040   PROTEINUR NEGATIVE 10/28/2015 1040   NITRITE NEGATIVE 10/28/2015 1040   LEUKOCYTESUR NEGATIVE 10/28/2015 1040    Radiological Exams on Admission: CT Lumbar Spine  Wo Contrast  Result Date: 09/21/2020 CLINICAL DATA:  Intermittent low back pain and spasms for approximately 1 month. The patient lost the ability to move her legs 2 weeks ago. EXAM: CT LUMBAR SPINE WITHOUT CONTRAST TECHNIQUE: Multidetector CT imaging of the lumbar spine was performed without intravenous contrast administration. Multiplanar CT image reconstructions were also generated. COMPARISON:  Report of CT chest 05/03/2013 reviewed. Images are not available. FINDINGS: Segmentation: The patient has transitional lumbosacral anatomy. Ribs are seen off the first lumbar vertebral body. On this study, the last fully open disc space is labeled L5-S1. Alignment: Trace facet mediated anterolisthesis L4 on L5. Vertebrae: No fracture or focal pathologic process. Marked degenerative endplate sclerosis is present at L4-5. Paraspinal and other soft tissues: There is a calcified left paraspinal mass which is likely the patient's left adrenal gland measures 5.3 x 3.4 cm. This is described on the report of the prior CT and is described as 2 x3 cm in size. Atherosclerotic  vascular disease also noted. Sigmoid diverticulosis is also seen. Disc levels: T10-11: Facet degenerative disease and a disc bulge. Moderate to moderately severe central canal stenosis and bilateral foraminal narrowing. T11-12: Facet degenerative disease.  Otherwise negative. T12-L1: Facet degenerative change.  Otherwise negative. L1-2: Moderate bilateral facet degenerative change. Minimal disc bulge. No stenosis. L2-3: Mild-to-moderate facet degenerative change. No disc bulge or stenosis. L3-4: Moderately severe to severe bilateral facet arthropathy. Shallow disc bulge. Mild-to-moderate central canal and bilateral foraminal narrowing. L4-5: Vacuum disc phenomenon. Broad-based disc bulge, ligamentum flavum thickening, endplate spurring and severe facet degenerative disease. Severe appearing central canal and bilateral foraminal narrowing. L5-S1: Advanced bilateral facet degenerative change. Vacuum disc phenomenon with a disc bulge. Mild to moderate central canal stenosis. Severe bilateral foraminal narrowing is worse on the left. IMPRESSION: Left adrenal lesion has enlarged in size based on description the previous report. Images are not available. Recommend non emergent adrenal protocol MRI with and without contrast for further evaluation. Multilevel spondylosis appears worst at L4-5 where there is marked central canal and bilateral foraminal narrowing. Advanced facet degenerative change is present is level. Severe bilateral foraminal narrowing at L5-S1 where there is also advanced facet degenerative change and mild to moderate central canal stenosis. Moderate to moderately severe appearing central canal and bilateral foraminal stenosis at T10-11. Transitional lumbosacral anatomy. Please see numbering scheme above and correlate with plain films if any intervention is planned. Electronically Signed   By: Drusilla Kanner M.D.   On: 09/21/2020 11:27    Assessment/Plan 1-back pain and inability to walk -With  concerns for degenerative disease and significant narrowing of the spinal canal -Patient expressed intermittent mild sciatica; no other red flags for acute spinal cord compression. -Conservative management with steroids, analgesics and physical therapy will be attempted -Neurosurgery has recommended outpatient follow-up. -Will check vitamin D, B12 and uric acid.  2-COPD (chronic obstructive pulmonary disease) (HCC) -Denies shortness of breath and currently is no wheezing -Continue as needed bronchodilators  3-tobacco abuse -Cessation counseling provided -Nicotine patch has been ordered.  4-Hypertension -Heart healthy diet has been encouraged -Continue home antihypertensive agents.  5-morbid obesity -Body mass index is 53.24 kg/m. -Low calorie diet, portion control and increase physical activity discussed with patient.  6-Gout -Will check uric acid -Patient reports no longer using colchicine -analgesic management will be provided as mentioned above  7-AKI on CKD stage 3a -Last creatinine level is 3 years ago -Unclear how much is progression of chronic kidney disease versus acute kidney injury component -Will minimize the  use of nephrotoxic agents -Provide fluid resuscitation overnight -Patient denies urinary retention or dysuria -Follow renal function trend.  In a.m.   DVT prophylaxis: Heparin Code Status:   Full code Family Communication:  No family at bedside. Disposition Plan:   Patient is from:  Home  Anticipated DC to:  To be determined.  Anticipated DC date:  09/22/20 (if patient's barriers overcome; otherwise will need to be transition to inpatient status and continue management).  Anticipated DC barriers: Pain control and ability to ambulate. Consults called:  Neurosurgery was curbside by EDP. Admission status:  Observation, MedSurg, length of stay less than 2 midnights.  Severity of Illness: Mild to moderate illness; patient presenting with worsening back pain  and inability to walk.  She reports ongoing back discomfort for for approximately 2-3 month and for the last 2 weeks has no being able to ambulate.  CT scan demonstrating significant degenerative disease with narrowing especially affecting L5-S1.  Case discussed with neurosurgery and no need for surgical intervention at this time.  Recommendations given for steroids and analgesics along with physical therapy.    Vassie Lollarlos Madera MD [facility]  How to contact the [facility] Attending or Consulting provider 7A - 7P or covering provider during after hours 7P -7A, for this patient?   1. Check the care team in [facility] and look for a) attending/consulting [facility] provider listed and b) the [facility] team listed 2. Log into [email or web address] and use [place]'s universal password to access. If you do not have the password, please contact the hospital operator. 3. Locate the [facility] provider you are looking for under [facility] and page to a number that you can be directly reached. 4. If you still have difficulty reaching the provider, please page the [facility] (Director on Call) for the Hospitalists listed on amion for assistance.  09/21/2020, 5:47 PM

## 2020-09-21 NOTE — ED Triage Notes (Signed)
Pt from home via [facility]. Pt reports chronic back pain. Patient states that 2 weeks ago she could not walk. Pt has sensation in bilateral extremities.

## 2020-09-22 DIAGNOSIS — Z79899 Other long term (current) drug therapy: Secondary | ICD-10-CM | POA: Diagnosis not present

## 2020-09-22 DIAGNOSIS — I10 Essential (primary) hypertension: Secondary | ICD-10-CM | POA: Diagnosis not present

## 2020-09-22 DIAGNOSIS — J449 Chronic obstructive pulmonary disease, unspecified: Secondary | ICD-10-CM | POA: Diagnosis not present

## 2020-09-22 DIAGNOSIS — Z7982 Long term (current) use of aspirin: Secondary | ICD-10-CM | POA: Diagnosis not present

## 2020-09-22 DIAGNOSIS — Z833 Family history of diabetes mellitus: Secondary | ICD-10-CM | POA: Diagnosis not present

## 2020-09-22 DIAGNOSIS — K219 Gastro-esophageal reflux disease without esophagitis: Secondary | ICD-10-CM | POA: Diagnosis present

## 2020-09-22 DIAGNOSIS — N179 Acute kidney failure, unspecified: Secondary | ICD-10-CM | POA: Diagnosis present

## 2020-09-22 DIAGNOSIS — M48061 Spinal stenosis, lumbar region without neurogenic claudication: Secondary | ICD-10-CM | POA: Diagnosis present

## 2020-09-22 DIAGNOSIS — M549 Dorsalgia, unspecified: Secondary | ICD-10-CM | POA: Diagnosis not present

## 2020-09-22 DIAGNOSIS — Z23 Encounter for immunization: Secondary | ICD-10-CM | POA: Diagnosis not present

## 2020-09-22 DIAGNOSIS — M109 Gout, unspecified: Secondary | ICD-10-CM | POA: Diagnosis present

## 2020-09-22 DIAGNOSIS — I129 Hypertensive chronic kidney disease with stage 1 through stage 4 chronic kidney disease, or unspecified chronic kidney disease: Secondary | ICD-10-CM | POA: Diagnosis present

## 2020-09-22 DIAGNOSIS — T380X5A Adverse effect of glucocorticoids and synthetic analogues, initial encounter: Secondary | ICD-10-CM | POA: Diagnosis not present

## 2020-09-22 DIAGNOSIS — M5430 Sciatica, unspecified side: Secondary | ICD-10-CM | POA: Diagnosis present

## 2020-09-22 DIAGNOSIS — R739 Hyperglycemia, unspecified: Secondary | ICD-10-CM | POA: Diagnosis present

## 2020-09-22 DIAGNOSIS — Z6841 Body Mass Index (BMI) 40.0 and over, adult: Secondary | ICD-10-CM | POA: Diagnosis not present

## 2020-09-22 DIAGNOSIS — Z20822 Contact with and (suspected) exposure to covid-19: Secondary | ICD-10-CM | POA: Diagnosis present

## 2020-09-22 DIAGNOSIS — Z888 Allergy status to other drugs, medicaments and biological substances status: Secondary | ICD-10-CM | POA: Diagnosis not present

## 2020-09-22 DIAGNOSIS — M4807 Spinal stenosis, lumbosacral region: Secondary | ICD-10-CM | POA: Diagnosis present

## 2020-09-22 DIAGNOSIS — R5381 Other malaise: Secondary | ICD-10-CM | POA: Diagnosis not present

## 2020-09-22 DIAGNOSIS — M545 Low back pain, unspecified: Secondary | ICD-10-CM | POA: Diagnosis present

## 2020-09-22 DIAGNOSIS — M4804 Spinal stenosis, thoracic region: Secondary | ICD-10-CM | POA: Diagnosis present

## 2020-09-22 DIAGNOSIS — F17210 Nicotine dependence, cigarettes, uncomplicated: Secondary | ICD-10-CM | POA: Diagnosis present

## 2020-09-22 DIAGNOSIS — Z90710 Acquired absence of both cervix and uterus: Secondary | ICD-10-CM | POA: Diagnosis not present

## 2020-09-22 DIAGNOSIS — D72829 Elevated white blood cell count, unspecified: Secondary | ICD-10-CM | POA: Diagnosis not present

## 2020-09-22 DIAGNOSIS — G8929 Other chronic pain: Secondary | ICD-10-CM | POA: Diagnosis present

## 2020-09-22 DIAGNOSIS — N1831 Chronic kidney disease, stage 3a: Secondary | ICD-10-CM | POA: Diagnosis present

## 2020-09-22 LAB — BASIC METABOLIC PANEL
Anion gap: 13 (ref 5–15)
BUN: 57 mg/dL — ABNORMAL HIGH (ref 8–23)
CO2: 18 mmol/L — ABNORMAL LOW (ref 22–32)
Calcium: 10.1 mg/dL (ref 8.9–10.3)
Chloride: 103 mmol/L (ref 98–111)
Creatinine, Ser: 1.32 mg/dL — ABNORMAL HIGH (ref 0.44–1.00)
GFR, Estimated: 46 mL/min — ABNORMAL LOW (ref >60)
Glucose, Bld: 122 mg/dL — ABNORMAL HIGH (ref 70–99)
Potassium: 4 mmol/L (ref 3.5–5.1)
Sodium: 134 mmol/L — ABNORMAL LOW (ref 135–145)

## 2020-09-22 LAB — CBC
HCT: 36.9 % (ref 36.0–46.0)
Hemoglobin: 11.6 g/dL — ABNORMAL LOW (ref 12.0–15.0)
MCH: 29.7 pg (ref 26.0–34.0)
MCHC: 31.4 g/dL (ref 30.0–36.0)
MCV: 94.6 fL (ref 80.0–100.0)
Platelets: 493 10*3/uL — ABNORMAL HIGH (ref 150–400)
RBC: 3.9 MIL/uL (ref 3.87–5.11)
RDW: 15.2 % (ref 11.5–15.5)
WBC: 22 10*3/uL — ABNORMAL HIGH (ref 4.0–10.5)
nRBC: 0 % (ref 0.0–0.2)

## 2020-09-22 LAB — HEMOGLOBIN A1C
Hgb A1c MFr Bld: 5.6 % (ref 4.8–5.6)
Mean Plasma Glucose: 114.02 mg/dL

## 2020-09-22 LAB — GLUCOSE, CAPILLARY: Glucose-Capillary: 186 mg/dL — ABNORMAL HIGH (ref 70–99)

## 2020-09-22 LAB — SARS CORONAVIRUS 2 (TAT 6-24 HRS): SARS Coronavirus 2: NEGATIVE

## 2020-09-22 MED ORDER — INSULIN ASPART 100 UNIT/ML ~~LOC~~ SOLN
0.0000 [IU] | Freq: Every day | SUBCUTANEOUS | Status: DC
  Administered 2020-09-23 – 2020-09-25 (×2): 2 [IU] via SUBCUTANEOUS

## 2020-09-22 MED ORDER — INSULIN ASPART 100 UNIT/ML ~~LOC~~ SOLN
0.0000 [IU] | Freq: Three times a day (TID) | SUBCUTANEOUS | Status: DC
  Administered 2020-09-23 (×2): 2 [IU] via SUBCUTANEOUS
  Administered 2020-09-23 – 2020-09-24 (×2): 1 [IU] via SUBCUTANEOUS
  Administered 2020-09-24: 2 [IU] via SUBCUTANEOUS
  Administered 2020-09-24 – 2020-09-25 (×3): 1 [IU] via SUBCUTANEOUS
  Administered 2020-09-25: 2 [IU] via SUBCUTANEOUS
  Administered 2020-09-26 (×2): 1 [IU] via SUBCUTANEOUS
  Administered 2020-09-26: 2 [IU] via SUBCUTANEOUS

## 2020-09-22 NOTE — TOC Progression Note (Signed)
Transition of Care [facility]) - Progression Note    Patient Details  Name: [name] MRN: [id number] Date of Birth: 1961/05/08  Transition of Care [facility]) CM/SW Contact  Roda Shutters Margretta Sidle, RN Phone Number: 09/22/2020, 3:25 PM   The Appropriate Use Committee has met to discuss this patient's plan of care to review recommendations for post-acute treatment options.  The options were reviewed with the attending MD, TOC, rehab services and the physician advisor.  All available documentation has been reviewed and the determination has been made that this patient does meet criteria for placement in a [facility] for short-term rehab. A consult to the Transitions of Care Team has been made to discuss discharge planning with the patient/family.

## 2020-09-22 NOTE — Evaluation (Signed)
Physical Therapy Evaluation Patient Details Name: [name] MRN: [id number] DOB: 31-Oct-1960 Today's Date: 09/22/2020   History of Present Illness  [name] is a 60 y.o. female with medical history significant of hypertension, morbid obesity, tobacco abuse, COPD, history of gout and gastroesophageal reflux disease; who is presented to the hospital secondary to worsening lower back pain and inability to walk.  Patient reports back pain has been present for the last 2 to 9-month but in the last 2 weeks has gotten severely worse and is impending here to ambulate.  Patient reports some mild intermittent sciatica symptoms, expressed pain to be dull and constant in the lower aspect of her back.  Physical exertion and ambulation makes it worse.  Patient denies fever, chills, abdominal pain, melena, hematochezia, dysuria, stools or urine incontinence, chest pain, shortness of breath, nausea, vomiting or sick contacts.  She continues to smoke.    Clinical Impression  Patient demonstrates slow labored movement for sitting up at bedside with difficulty propping up on elbows due to weakness, unable to complete sit to stand after multiple attempts due to BLE weakness and fatigue.  Patient tolerated sitting up at bedside after therapy - RN notified.  Patient will benefit from continued physical therapy in hospital and recommended venue below to increase strength, balance, endurance for safe ADLs and gait.     Follow Up Recommendations SNF    Equipment Recommendations  Rolling walker with 5" wheels;3in1 (PT)    Recommendations for Other Services       Precautions / Restrictions Precautions Precautions: Fall Restrictions Weight Bearing Restrictions: No      Mobility  Bed Mobility Overal bed mobility: Needs Assistance Bed Mobility: Supine to Sit     Supine to sit: Mod assist          Transfers                    Ambulation/Gait                Stairs             Wheelchair Mobility    Modified Rankin (Stroke Patients Only)       Balance Overall balance assessment: Needs assistance Sitting-balance support: Feet supported;No upper extremity supported Sitting balance-Leahy Scale: Good Sitting balance - Comments: seated at EOB                                     Pertinent Vitals/Pain Pain Assessment: Faces Faces Pain Scale: Hurts a little bit Pain Location: low back with  movement Pain Descriptors / Indicators: Aching;Discomfort Pain Intervention(s): Limited activity within patient's tolerance;Monitored during session;Repositioned    Home Living Family/patient expects to be discharged to:: Private residence Living Arrangements: Children Available Help at Discharge: Family;Available PRN/intermittently Type of Home: House Home Access: Stairs to enter Entrance Stairs-Rails: Right;Left;Can reach both Entrance Stairs-Number of Steps: 2 Home Layout: One level;Laundry or work area in basement;Other (Comment) (Patient states she does not go to basement) Home Equipment: Cane - single point;Crutches      Prior Function Level of Independence: Independent with assistive device(s)         Comments: Community ambulator using SPC or crutch, drives     Hand Dominance   Dominant Hand: Right    Extremity/Trunk Assessment   Upper Extremity Assessment Upper Extremity Assessment: Generalized weakness    Lower Extremity Assessment Lower Extremity Assessment: Generalized  weakness    Cervical / Trunk Assessment Cervical / Trunk Assessment: Normal  Communication   Communication: No difficulties  Cognition Arousal/Alertness: Awake/alert Behavior During Therapy: WFL for tasks assessed/performed Overall Cognitive Status: Within Functional Limits for tasks assessed                                        General Comments      Exercises     Assessment/Plan    PT Assessment Patient needs continued PT  services  PT Problem List Decreased strength;Decreased activity tolerance;Decreased balance;Decreased mobility       PT Treatment Interventions DME instruction;Gait training;Stair training;Functional mobility training;Therapeutic activities;Therapeutic exercise;Balance training;Patient/family education    PT Goals (Current goals can be found in the Care Plan section)  Acute Rehab PT Goals Patient Stated Goal: return home after rehab PT Goal Formulation: With patient Time For Goal Achievement: 10/06/20 Potential to Achieve Goals: Good    Frequency Min 3X/week   Barriers to discharge        Co-evaluation               AM-PAC PT "6 Clicks" Mobility  Outcome Measure Help needed turning from your back to your side while in a flat bed without using bedrails?: A Lot Help needed moving from lying on your back to sitting on the side of a flat bed without using bedrails?: A Lot Help needed moving to and from a bed to a chair (including a wheelchair)?: Total Help needed standing up from a chair using your arms (e.g., wheelchair or bedside chair)?: Total Help needed to walk in hospital room?: Total Help needed climbing 3-5 steps with a railing? : Total 6 Click Score: 8    End of Session   Activity Tolerance: Patient tolerated treatment well;Patient limited by fatigue Patient left: in bed;with call bell/phone within reach Nurse Communication: Mobility status PT Visit Diagnosis: Unsteadiness on feet (R26.81);Other abnormalities of gait and mobility (R26.89);Muscle weakness (generalized) (M62.81)    Time: 4235-3614 PT Time Calculation (min) (ACUTE ONLY): 27 min   Charges:   PT Evaluation $PT Eval Moderate Complexity: 1 Mod PT Treatments $Therapeutic Activity: 23-37 mins        2:16 PM, 09/22/20 Ocie Bob, MPT Physical Therapist with [facility] [phone] office [phone] mobile phone

## 2020-09-22 NOTE — Progress Notes (Signed)
PROGRESS NOTE    [name]  PPI:[id number] DOB: [birth year] DOA: 09/21/2020 PCP: [facility]    Chief Complaint  Patient presents with  . Back Pain    Brief admission Narrative:  [name] is a 60 y.o. female with medical history significant of hypertension, morbid obesity, tobacco abuse, COPD, history of gout and gastroesophageal reflux disease; who is presented to the hospital secondary to worsening lower back pain and inability to walk.  Patient reports back pain has been present for the last 2 to 85-month but in the last 2 weeks has gotten severely worse and is impending here to ambulate.  Patient reports some mild intermittent sciatica symptoms, expressed pain to be dull and constant in the lower aspect of her back.  Physical exertion and ambulation makes it worse.  Patient denies fever, chills, abdominal pain, melena, hematochezia, dysuria, stools or urine incontinence, chest pain, shortness of breath, nausea, vomiting or sick contacts.  She continues to smoke.  Still having difficulties with intermittent low back pain requiring IV analgesics and with ongoing therapy using IV steroids.  Plan: In need for skilled nursing facility at time of discharge by physical therapy.  Continue current therapy and follow clinical response.  Outpatient follow-up with neurosurgery has been recommended.  Patient's obesity/body habitus also contributing in her ongoing symptoms and inability for ambulation.  Assessment & Plan: 1-Acute on chronic back pain and inability to walk -Images demonstrating extensive degenerative changes and significant narrowing of the spinal canal. -Patient also reporting intermittent sciatica symptoms. -No urinary incontinence or retention symptoms; patient also denies any stool incontinence. -There is no reported no numbness in her lower extremities. -Case discussed with neurosurgery who recommended continue conservative management and outpatient  follow-up. -Will continue IV steroids, as needed analgesics and supportive care. -Continue the use of Neurontin and physical therapy.  2-morbid obesity -Body mass index is 53.24 kg/m. -Low calorie diet, portion control, increase physical activity lifestyle modification discussed with patient. -Patient will benefit of outpatient follow-up with bariatric clinic.  3-tobacco abuse -Cessation counseling provided -Continue nicotine patch.  4-COPD -No shortness of breath, no wheezing no tachypnea appreciated -Continue as needed bronchodilators. -As mentioned above tobacco cessation counseling has been provided.  5-acute kidney injury on chronic kidney disease stage IIIa -Continue minimizing the use of nephrotoxic agents -good response to gentle fluid resuscitation -Cr down to 1.3 currently -will follow trend -advise to maintain adequate hydration.  6-glycemia -Without prior history of diabetes; no records indicating last A1c in the 3 diabetic range. -Steroids most likely causing demargination and increase CBGs -Will check A1c -No signs of infection so we will continue holding on antibiotics. -Follow-up WBCs trend.  7-hypertension -Continue current antihypertensive regimen -Follow vital signs.  DVT prophylaxis: Heparin Code Status: Full code Family Communication: No family at bedside.  Patient expressed that she will contact her son and provide updates of her condition. Disposition:   Status is: Inpatient  Dispo: The patient is from: home              Anticipated d/c is to: SNF              Anticipated d/c date is: 3 days or so              Patient currently no medically stable for discharge; still requiring IV analgesics and IV steroids.  Followed by physical therapy and seen high risk for falls and in need for rehabilitation at a skilled nursing facility  after discharge.   Difficult to place patient not yet    Consultants:   Neurosurgery (Dr. Lovell SheehanJenkins) curbside.  No need  for acute surgical intervention at this moment.  Continue conservative management with steroids, analgesics and physical therapy.  Outpatient follow-up with neurosurgery as an outpatient recommended.   Procedures:  See below for x-ray report.   Antimicrobials:  None   Subjective: No fever, no chest pain, no nausea, no vomiting, no shortness of breath.  Continue to express intermittent ongoing lower back pain and inability to ambulate.  Objective: Vitals:   09/21/20 2044 09/22/20 0150 09/22/20 0526 09/22/20 1419  BP: (!) 142/75 135/74 119/60 135/67  Pulse: (!) 110 (!) 108 89 87  Resp: 19 18 19 20   Temp: 97.9 F (36.6 C) 97.8 F (36.6 C) 98.2 F (36.8 C) 97.6 F (36.4 C)  TempSrc:  Oral  Oral  SpO2: 96% 98% 100% 99%  Weight:      Height:        Intake/Output Summary (Last 24 hours) at 09/22/2020 1651 Last data filed at 09/22/2020 1300 Gross per 24 hour  Intake 1702.56 ml  Output 800 ml  Net 902.56 ml   Filed Weights   09/21/20 0950 09/21/20 1730  Weight: (!) 154.2 kg (!) 154.2 kg    Examination:  General exam: Oriented x3, no fever, no chest pain, no nausea, no vomiting, no shortness of breath.  Patient reports ongoing significant lower back pain and inability to stand up or walk at this moment.  She is receiving IV steroids and IV analgesics. Respiratory system: Clear to auscultation. Respiratory effort normal.  No wheezing, no crackles. Cardiovascular system: S1 & S2 heard, RRR.  No rubs, no gallops, no murmurs.  Unable to properly assess JVD with body habitus. Gastrointestinal system: Abdomen is obese, nondistended, soft and nontender. No organomegaly or masses felt. Normal bowel sounds heard. Central nervous system: No focal neurological deficits. Extremities: No cyanosis or clubbing.  2-3+ edema/lymphedema unchanged from baseline according to patient. Skin: No rashes, no petechiae. Psychiatry: Judgement and insight appear normal. Mood & affect appropriate.      Data Reviewed: I have personally reviewed following labs and imaging studies  CBC: Recent Labs  Lab 09/21/20 1029 09/22/20 0603  WBC 23.3* 22.0*  NEUTROABS 19.3*  --   HGB 13.1 11.6*  HCT 40.6 36.9  MCV 92.9 94.6  PLT 565* 493*    Basic Metabolic Panel: Recent Labs  Lab 09/21/20 1029 09/21/20 1428 09/22/20 0603  NA 134*  --  134*  K 3.6  --  4.0  CL 100  --  103  CO2 21*  --  18*  GLUCOSE 119*  --  122*  BUN 57*  --  57*  CREATININE 1.59*  --  1.32*  CALCIUM 10.3  --  10.1  MG  --  2.3  --   PHOS  --  4.7*  --     GFR: Estimated Creatinine Clearance: 68.8 mL/min (A) (by C-G formula based on SCr of 1.32 mg/dL (H)).  Liver Function Tests: Recent Labs  Lab 09/21/20 1029  AST 38  ALT 30  ALKPHOS 225*  BILITOT 2.4*  PROT 7.9  ALBUMIN 2.8*    CBG: No results for input(s): GLUCAP in the last 168 hours.   Recent Results (from the past 240 hour(s))  SARS CORONAVIRUS 2 (TAT 6-24 HRS) Nasopharyngeal Nasopharyngeal Swab     Status: None   Collection Time: 09/21/20  4:09 PM   Specimen: Nasopharyngeal  Swab  Result Value Ref Range Status   SARS Coronavirus 2 NEGATIVE NEGATIVE Final    Comment: (NOTE) SARS-CoV-2 target nucleic acids are NOT DETECTED.  The SARS-CoV-2 RNA is generally detectable in upper and lower respiratory specimens during the acute phase of infection. Negative results do not preclude SARS-CoV-2 infection, do not rule out co-infections with other pathogens, and should not be used as the sole basis for treatment or other patient management decisions. Negative results must be combined with clinical observations, patient history, and epidemiological information. The expected result is Negative.  Fact Sheet for Patients: HairSlick.no  Fact Sheet for Healthcare Providers: quierodirigir.com  This test is not yet approved or cleared by the [place] FDA and  has been authorized for  detection and/or diagnosis of SARS-CoV-2 by FDA under an Emergency Use Authorization (EUA). This EUA will remain  in effect (meaning this test can be used) for the duration of the COVID-19 declaration under Se ction 564(b)(1) of the Act, 21 U.S.C. section 360bbb-3(b)(1), unless the authorization is terminated or revoked sooner.  Performed at [facility], [address], [place], [place] [address]      Radiology Studies: CT Lumbar Spine Wo Contrast  Result Date: 09/21/2020 CLINICAL DATA:  Intermittent low back pain and spasms for approximately 1 month. The patient lost the ability to move her legs 2 weeks ago. EXAM: CT LUMBAR SPINE WITHOUT CONTRAST TECHNIQUE: Multidetector CT imaging of the lumbar spine was performed without intravenous contrast administration. Multiplanar CT image reconstructions were also generated. COMPARISON:  Report of CT chest 05/03/2013 reviewed. Images are not available. FINDINGS: Segmentation: The patient has transitional lumbosacral anatomy. Ribs are seen off the first lumbar vertebral body. On this study, the last fully open disc space is labeled L5-S1. Alignment: Trace facet mediated anterolisthesis L4 on L5. Vertebrae: No fracture or focal pathologic process. Marked degenerative endplate sclerosis is present at L4-5. Paraspinal and other soft tissues: There is a calcified left paraspinal mass which is likely the patient's left adrenal gland measures 5.3 x 3.4 cm. This is described on the report of the prior CT and is described as 2 x3 cm in size. Atherosclerotic vascular disease also noted. Sigmoid diverticulosis is also seen. Disc levels: T10-11: Facet degenerative disease and a disc bulge. Moderate to moderately severe central canal stenosis and bilateral foraminal narrowing. T11-12: Facet degenerative disease.  Otherwise negative. T12-L1: Facet degenerative change.  Otherwise negative. L1-2: Moderate bilateral facet degenerative change. Minimal disc bulge. No  stenosis. L2-3: Mild-to-moderate facet degenerative change. No disc bulge or stenosis. L3-4: Moderately severe to severe bilateral facet arthropathy. Shallow disc bulge. Mild-to-moderate central canal and bilateral foraminal narrowing. L4-5: Vacuum disc phenomenon. Broad-based disc bulge, ligamentum flavum thickening, endplate spurring and severe facet degenerative disease. Severe appearing central canal and bilateral foraminal narrowing. L5-S1: Advanced bilateral facet degenerative change. Vacuum disc phenomenon with a disc bulge. Mild to moderate central canal stenosis. Severe bilateral foraminal narrowing is worse on the left. IMPRESSION: Left adrenal lesion has enlarged in size based on description the previous report. Images are not available. Recommend non emergent adrenal protocol MRI with and without contrast for further evaluation. Multilevel spondylosis appears worst at L4-5 where there is marked central canal and bilateral foraminal narrowing. Advanced facet degenerative change is present is level. Severe bilateral foraminal narrowing at L5-S1 where there is also advanced facet degenerative change and mild to moderate central canal stenosis. Moderate to moderately severe appearing central canal and bilateral foraminal stenosis at T10-11. Transitional lumbosacral  anatomy. Please see numbering scheme above and correlate with plain films if any intervention is planned. Electronically Signed   By: Drusilla Kanner M.D.   On: 09/21/2020 11:27    Scheduled Meds: . aspirin EC  81 mg Oral Daily  . gabapentin  100 mg Oral BID  . heparin injection (subcutaneous)  5,000 Units Subcutaneous Q8H  . methylPREDNISolone (SOLU-MEDROL) injection  40 mg Intravenous Q12H  . metoprolol tartrate  25 mg Oral BID  . nicotine  14 mg Transdermal Daily  . NIFEdipine  90 mg Oral Daily  . pantoprazole  40 mg Oral Daily   Continuous Infusions: . methocarbamol (ROBAXIN) IV       LOS: 0 days    Time spent: 35  minutes   Vassie Loll, MD [facility]   To contact the attending provider between 7A-7P or the covering provider during after hours 7P-7A, please log into the web site [email or web address] and access using universal [place] password for that web site. If you do not have the password, please call the hospital operator.  09/22/2020, 4:51 PM

## 2020-09-22 NOTE — Plan of Care (Signed)
°  Problem: Acute Rehab PT Goals(only PT should resolve) Goal: Pt Will Go Supine/Side To Sit Outcome: Progressing Flowsheets (Taken 09/22/2020 1418) Pt will go Supine/Side to Sit: with minimal assist Goal: Patient Will Transfer Sit To/From Stand Outcome: Progressing Flowsheets (Taken 09/22/2020 1418) Patient will transfer sit to/from stand: with moderate assist Goal: Pt Will Transfer Bed To Chair/Chair To Bed Outcome: Progressing Flowsheets (Taken 09/22/2020 1418) Pt will Transfer Bed to Chair/Chair to Bed: with mod assist Goal: Pt Will Ambulate Outcome: Progressing Flowsheets (Taken 09/22/2020 1418) Pt will Ambulate:  10 feet  with moderate assist  with rolling walker   2:19 PM, 09/22/20 Ocie Bob, MPT Physical Therapist with [facility] [phone] office [phone] mobile phone

## 2020-09-23 LAB — GLUCOSE, CAPILLARY
Glucose-Capillary: 152 mg/dL — ABNORMAL HIGH (ref 70–99)
Glucose-Capillary: 175 mg/dL — ABNORMAL HIGH (ref 70–99)
Glucose-Capillary: 130 mg/dL — ABNORMAL HIGH (ref 70–99)
Glucose-Capillary: 229 mg/dL — ABNORMAL HIGH (ref 70–99)

## 2020-09-23 NOTE — [place] FL2 (Signed)
[place] MEDICAID FL2 LEVEL OF CARE SCREENING TOOL     IDENTIFICATION  Patient Name: [name] Birthdate: May 28, 1961 Sex: female Admission Date (Current Location): 09/21/2020  [facility] and [place] Number:  [employer] and Address:  [facility],  [address] [place] [address]      Provider Number: [id number]  Attending Physician Name and Address:  Vassie Loll, MD  Relative Name and Phone Number:  Mariane Duval - son  [phone]    Current Level of Care: Hospital Recommended Level of Care: Skilled Nursing Facility Prior Approval Number:    Date Approved/Denied:   PASRR Number: [id number]  Discharge Plan: SNF    Current Diagnoses: Patient Active Problem List   Diagnosis Date Noted  . Back pain 09/21/2020  . Gout 09/21/2020  . COPD (chronic obstructive pulmonary disease) (HCC) 04/03/2012  . Bronchitis 04/03/2012  . Tobacco abuse 04/03/2012  . Hypertension 04/03/2012  . Hyperglycemia 04/03/2012  . Obesity 04/03/2012    Orientation RESPIRATION BLADDER Height & Weight     Self,Time,Situation,Place  Normal External catheter Weight: (!) 154.2 kg Height:  5\' 7"  (170.2 cm)  BEHAVIORAL SYMPTOMS/MOOD NEUROLOGICAL BOWEL NUTRITION STATUS      Continent Diet (See DC Summary)  AMBULATORY STATUS COMMUNICATION OF NEEDS Skin   Extensive Assist Verbally Normal                       Personal Care Assistance Level of Assistance  Bathing,Feeding,Dressing Bathing Assistance: Maximum assistance Feeding assistance: Limited assistance Dressing Assistance: Limited assistance     Functional Limitations Info  Sight,Hearing,Speech Sight Info: Adequate Hearing Info: Adequate Speech Info: Adequate    SPECIAL CARE FACTORS FREQUENCY  PT (By licensed PT)     PT Frequency: 5 times a week              Contractures Contractures Info: Not present    Additional Factors Info  Code Status,Allergies Code Status Info: FULL Allergies Info:  HCTZ           Current Medications (09/23/2020):  This is the current hospital active medication list Current Facility-Administered Medications  Medication Dose Route Frequency Provider Last Rate Last Admin  . albuterol (VENTOLIN HFA) 108 (90 Base) MCG/ACT inhaler 2 puff  2 puff Inhalation Q4H PRN 09/25/2020, MD      . aspirin EC tablet 81 mg  81 mg Oral Daily Vassie Loll, MD   81 mg at 09/23/20 09/25/20  . gabapentin (NEURONTIN) capsule 100 mg  100 mg Oral BID 4132, MD   100 mg at 09/23/20 0825  . heparin injection 5,000 Units  5,000 Units Subcutaneous Q8H 09/25/20, MD   5,000 Units at 09/23/20 0515  . HYDROmorphone (DILAUDID) injection 1 mg  1 mg Intravenous Q3H PRN 09/25/20, MD   1 mg at 09/23/20 0200  . insulin aspart (novoLOG) injection 0-5 Units  0-5 Units Subcutaneous QHS 09/25/20, MD      . insulin aspart (novoLOG) injection 0-9 Units  0-9 Units Subcutaneous TID WC Vassie Loll, MD   2 Units at 09/23/20 [phone]  . ipratropium (ATROVENT HFA) inhaler 2 puff  2 puff Inhalation Q4H PRN 4401, MD      . methocarbamol (ROBAXIN) 500 mg in dextrose 5 % 50 mL IVPB  500 mg Intravenous Q8H PRN Vassie Loll, MD      . methylPREDNISolone sodium succinate (SOLU-MEDROL) 40 mg/mL injection 40 mg  40 mg Intravenous Q12H Vassie Loll,  MD   40 mg at 09/23/20 0400  . metoprolol tartrate (LOPRESSOR) tablet 25 mg  25 mg Oral BID Vassie Loll, MD   25 mg at 09/23/20 0825  . nicotine (NICODERM CQ - dosed in mg/24 hours) patch 14 mg  14 mg Transdermal Daily Vassie Loll, MD   14 mg at 09/23/20 3329  . NIFEdipine (PROCARDIA-XL/NIFEDICAL-XL) 24 hr tablet 90 mg  90 mg Oral Daily Vassie Loll, MD   90 mg at 09/23/20 0825  . ondansetron (ZOFRAN) tablet 4 mg  4 mg Oral Q6H PRN Vassie Loll, MD       Or  . ondansetron [facility]) injection 4 mg  4 mg Intravenous Q6H PRN Vassie Loll, MD      . pantoprazole (PROTONIX) EC tablet 40 mg  40 mg Oral Daily Vassie Loll,  MD   40 mg at 09/23/20 0825     Discharge Medications: Please see discharge summary for a list of discharge medications.  Relevant Imaging Results:  Relevant Lab Results:   Additional Information SS# [id number]  Leitha Bleak, RN

## 2020-09-23 NOTE — Progress Notes (Signed)
Physical Therapy Treatment Patient Details Name: [name] MRN: [id number] DOB: 1961-07-04 Today's Date: 09/23/2020    History of Present Illness [name] is a 60 y.o. female with medical history significant of hypertension, morbid obesity, tobacco abuse, COPD, history of gout and gastroesophageal reflux disease; who is presented to the hospital secondary to worsening lower back pain and inability to walk.  Patient reports back pain has been present for the last 2 to 81-month but in the last 2 weeks has gotten severely worse and is impending here to ambulate.  Patient reports some mild intermittent sciatica symptoms, expressed pain to be dull and constant in the lower aspect of her back.  Physical exertion and ambulation makes it worse.  Patient denies fever, chills, abdominal pain, melena, hematochezia, dysuria, stools or urine incontinence, chest pain, shortness of breath, nausea, vomiting or sick contacts.  She continues to smoke.    PT Comments    Patient demonstrates improvement for sitting up at bedside with [facility] raised and use of bed rail, but required increased time with labored movement.  Patient able to partially lift bottom off bed during attempts to sit to stand with bed elevated, knees blocked and using heavy duty RW and unable to lock knees due to weakness.  Patient tolerated sitting up at bedside after therapy - RN aware.  Patient will benefit from continued physical therapy in hospital and recommended venue below to increase strength, balance, endurance for safe ADLs and gait.    Follow Up Recommendations  SNF     Equipment Recommendations  Rolling walker with 5" wheels;3in1 (PT)    Recommendations for Other Services       Precautions / Restrictions Precautions Precautions: Fall Restrictions Weight Bearing Restrictions: No    Mobility  Bed Mobility Overal bed mobility: Needs Assistance Bed Mobility: Supine to Sit     Supine to sit: Min guard;HOB elevated      General bed mobility comments: increased time, labored movement, had to use bed rail    Transfers Overall transfer level: Needs assistance Equipment used: Rolling walker (2 wheeled) Transfers: Sit to/from Stand Sit to Stand: Max assist;From elevated surface         General transfer comment: able to partially lift bottom off bed with bed elevated and Max assist  Ambulation/Gait                 Stairs             Wheelchair Mobility    Modified Rankin (Stroke Patients Only)       Balance Overall balance assessment: Needs assistance Sitting-balance support: Feet supported;No upper extremity supported Sitting balance-Leahy Scale: Good Sitting balance - Comments: seated at EOB   Standing balance support: During functional activity;Bilateral upper extremity supported Standing balance-Leahy Scale: Zero Standing balance comment: using RW                            Cognition Arousal/Alertness: Awake/alert Behavior During Therapy: WFL for tasks assessed/performed Overall Cognitive Status: Within Functional Limits for tasks assessed                                        Exercises General Exercises - Lower Extremity Long Arc Quad: Seated;AROM;Strengthening;Both;15 reps Hip Flexion/Marching: Seated;Strengthening;Both;15 reps Toe Raises: Seated;Strengthening;Both;15 reps Heel Raises: Seated;Strengthening;Both;15 reps    General Comments  Pertinent Vitals/Pain Pain Assessment: No/denies pain    Home Living                      Prior Function            PT Goals (current goals can now be found in the care plan section) Acute Rehab PT Goals Patient Stated Goal: return home after rehab PT Goal Formulation: With patient Time For Goal Achievement: 10/06/20 Potential to Achieve Goals: Good Progress towards PT goals: Progressing toward goals    Frequency    Min 3X/week      PT Plan       Co-evaluation              AM-PAC PT "6 Clicks" Mobility   Outcome Measure  Help needed turning from your back to your side while in a flat bed without using bedrails?: A Little Help needed moving from lying on your back to sitting on the side of a flat bed without using bedrails?: A Little Help needed moving to and from a bed to a chair (including a wheelchair)?: Total Help needed standing up from a chair using your arms (e.g., wheelchair or bedside chair)?: Total Help needed to walk in hospital room?: Total Help needed climbing 3-5 steps with a railing? : Total 6 Click Score: 10    End of Session   Activity Tolerance: Patient tolerated treatment well;Patient limited by fatigue Patient left: in bed;with call bell/phone within reach Nurse Communication: Mobility status PT Visit Diagnosis: Unsteadiness on feet (R26.81);Other abnormalities of gait and mobility (R26.89);Muscle weakness (generalized) (M62.81)     Time: 1517-6160 PT Time Calculation (min) (ACUTE ONLY): 32 min  Charges:  $Therapeutic Exercise: 8-22 mins $Therapeutic Activity: 8-22 mins                     3:50 PM, 09/23/20 Ocie Bob, MPT Physical Therapist with [facility] [phone] office [phone] mobile phone

## 2020-09-23 NOTE — TOC Initial Note (Signed)
Transition of Care [facility] - Initial/Assessment Note    Patient Details  Name: [name] MRN: [id number] Date of Birth: 17-Aug-1960  Transition of Care [facility]) CM/SW Contact:    Leitha Bleak, RN Phone Number: 09/23/2020, 1:53 PM  Clinical Narrative:   Committee approved to discuss DC planning with family. PT is recommending SNF.  TOC spoke with patient she was open to going to SNF, wanted to stay local. Berna Spare, her son called and stated she lives with him, he will be there at night and they have enough family to care for her during the day. She has a walker and cane. MD updated. Denyse Amass with [facility] accepted the referral for PT only.           Expected Discharge Plan: Home w Home Health Services Barriers to Discharge: Continued Medical Work up   Patient Goals and CMS Choice Patient states their goals for this hospitalization and ongoing recovery are:: to get better. CMS Medicare.gov Compare Post Acute Care list provided to:: Patient Choice offered to / list presented to : Patient  Expected Discharge Plan and Services Expected Discharge Plan: Home w Home Health Services    HH Arranged: PT   Date [facility] Agency Contacted: 09/23/20 Time HH Agency Contacted: 1353 Representative spoke with at [facility] Agency: Denyse Amass  Prior Living Arrangements/Services   Lives with:: Adult Children   Do you feel safe going back to the place where you live?: Yes      Need for Family Participation in Patient Care: Yes (Comment) Care giver support system in place?: Yes (comment)   Criminal Activity/Legal Involvement Pertinent to Current Situation/Hospitalization: No - Comment as needed  Activities of Daily Living Home Assistive Devices/Equipment: None ADL Screening (condition at time of admission) Patient's cognitive ability adequate to safely complete daily activities?: Yes Is the patient deaf or have difficulty hearing?: No Does the patient have difficulty seeing, even when wearing glasses/contacts?:  No Does the patient have difficulty concentrating, remembering, or making decisions?: No Patient able to express need for assistance with ADLs?: Yes Does the patient have difficulty dressing or bathing?: Yes Independently performs ADLs?: No Communication: Independent Dressing (OT): Needs assistance Is this a change from baseline?: Pre-admission baseline Grooming: Needs assistance Is this a change from baseline?: Pre-admission baseline Feeding: Independent Bathing: Needs assistance Is this a change from baseline?: Pre-admission baseline Toileting: Needs assistance Is this a change from baseline?: Pre-admission baseline In/Out Bed: Needs assistance Is this a change from baseline?: Pre-admission baseline Walks in Home: Needs assistance Is this a change from baseline?: Pre-admission baseline Does the patient have difficulty walking or climbing stairs?: Yes Weakness of Legs: Both Weakness of Arms/Hands: None  Permission Sought/Granted      Share Information with NAME: Berna Spare     Permission granted to share info w Relationship: Son     Emotional Assessment     Affect (typically observed): Accepting Orientation: : Oriented to Self,Oriented to Place,Oriented to  Time,Oriented to Situation Alcohol / Substance Use: Not Applicable Psych Involvement: No (comment)  Admission diagnosis:  Debility [R53.81] Back pain [M54.9] Chronic back pain, unspecified back location, unspecified back pain laterality [M54.9, G89.29] Patient Active Problem List   Diagnosis Date Noted  . Back pain 09/21/2020  . Gout 09/21/2020  . COPD (chronic obstructive pulmonary disease) (HCC) 04/03/2012  . Bronchitis 04/03/2012  . Tobacco abuse 04/03/2012  . Hypertension 04/03/2012  . Hyperglycemia 04/03/2012  . Obesity 04/03/2012   PCP:  [facility] Pharmacy:  [facility] [employer] - [place], [place] - [address] [place] [address] Phone:  [phone] Fax: [phone]   Readmission Risk Interventions No flowsheet data found.

## 2020-09-23 NOTE — Progress Notes (Signed)
PROGRESS NOTE    [name]  KVQ:[id number] DOB: [birth year] DOA: 09/21/2020 PCP: [facility]    Chief Complaint  Patient presents with  . Back Pain    Brief admission Narrative:  [name] is a 60 y.o. female with medical history significant of hypertension, morbid obesity, tobacco abuse, COPD, history of gout and gastroesophageal reflux disease; who is presented to the hospital secondary to worsening lower back pain and inability to walk.  Patient reports back pain has been present for the last 2 to 68-month but in the last 2 weeks has gotten severely worse and is impending here to ambulate.  Patient reports some mild intermittent sciatica symptoms, expressed pain to be dull and constant in the lower aspect of her back.  Physical exertion and ambulation makes it worse.  Patient denies fever, chills, abdominal pain, melena, hematochezia, dysuria, stools or urine incontinence, chest pain, shortness of breath, nausea, vomiting or sick contacts.  She continues to smoke.  Still having difficulties with intermittent low back pain requiring IV analgesics and with ongoing therapy using IV steroids.  Plan: In need for skilled nursing facility at time of discharge by physical therapy.  Continue current therapy and follow clinical response.  Outpatient follow-up with neurosurgery has been recommended.  Patient's obesity/body habitus also contributing in her ongoing symptoms and inability for ambulation.  Assessment & Plan: 1-Acute on chronic back pain and inability to walk -Images demonstrating extensive degenerative changes and significant narrowing of the spinal canal. -Patient continued reporting intermittent sciatica symptoms, but is slightly better. -No urinary incontinence or retention symptoms; patient also denies any stool incontinence. -Patient is now reporting numbness in her lower extremities. -Case discussed with neurosurgery who recommended continue  conservative management and outpatient follow-up. -Will continue IV steroids, as needed analgesics and supportive care. -Continue the use of Neurontin and physical therapy. -Physical therapy evaluation recommending a skilled nursing facility and rolling walker; family not interested in nursing home placement and requesting home health services.  2-morbid obesity -Body mass index is 53.24 kg/m. -Low calorie diet, portion control, increase physical activity lifestyle modification discussed with patient. -Patient will benefit of outpatient follow-up with bariatric clinic.  3-tobacco abuse -Cessation counseling provided -Continue nicotine patch.  4-history of COPD -No shortness of breath, no wheezing no tachypnea appreciated -Continue as needed bronchodilators. -As mentioned above tobacco cessation counseling has been provided.  5-acute kidney injury on chronic kidney disease stage IIIa -Continue minimizing the use of nephrotoxic agents -good response to gentle fluid resuscitation -Cr down to 1.3 currently -will follow trend -advise to maintain adequate hydration.  6-hyperglycemia -Without prior history of diabetes; records indicating last A1c in the pre-diabetic range (5.9). -Steroids most likely causing demargination and increase CBGs -A1c 5.6 currently  7-leukocytosis -No signs of infection so we will continue holding on antibiotics. -Follow-up WBCs trend.  8-hypertension -Continue current antihypertensive regimen -Follow vital signs.  DVT prophylaxis: Heparin Code Status: Full code Family Communication: No family at bedside.  Patient expressed that she will contact her son and provide updates of her condition. Disposition:   Status is: Inpatient  Dispo: The patient is from: home              Anticipated d/c is to: SNF              Anticipated d/c date is: 1 day or so              Patient currently no medically stable for  discharge; still requiring IV analgesics and  IV steroids.  Followed by physical therapy and seen high risk for falls and in need for rehabilitation at a skilled nursing facility after discharge.   Difficult to place patient not yet    Consultants:   Neurosurgery (Dr. Lovell Sheehan) curbside.  No need for acute surgical intervention at this moment.  Continue conservative management with steroids, analgesics and physical therapy.  Outpatient follow-up with neurosurgery as an outpatient recommended.   Procedures:  See below for x-ray report.   Antimicrobials:  None   Subjective: No fever, no chest pain, no nausea, no vomiting, no dysuria, no incontinence.  Patient denies lower extremity numbness.  Slight improvement in her is still ongoing lower back pain and debility.  Objective: Vitals:   09/22/20 2027 09/23/20 0438 09/23/20 0821 09/23/20 1030  BP: 128/65 (!) 112/53 125/65   Pulse: 75 68 78 62  Resp: 18 16 16    Temp: 98.2 F (36.8 C) 98 F (36.7 C)    TempSrc: Oral Oral    SpO2: 97% 96% 98% 97%  Weight:      Height:        Intake/Output Summary (Last 24 hours) at 09/23/2020 1724 Last data filed at 09/23/2020 0500 Gross per 24 hour  Intake 340 ml  Output 450 ml  Net -110 ml   Filed Weights   09/21/20 0950 09/21/20 1730  Weight: (!) 154.2 kg (!) 154.2 kg    Examination: General exam: Alert, awake, oriented x 3; no fever, no CP, no nausea, no vomiting, no SOB.  Continues to have lower back pain requiring IV medication and experiencing inability to ambulate by herself. Respiratory system: Clear to auscultation. Respiratory effort normal.  No wheezing, no crackles, no shortness of breath. Cardiovascular system:RRR. No murmurs, rubs, gallops.  Unable to assess JVD with body habitus. Gastrointestinal system: Abdomen is obese, nondistended, soft and nontender. No organomegaly or masses felt. Normal bowel sounds heard. Central nervous system: Alert and oriented. No focal neurological deficits. Extremities: No cyanosis or  clubbing.  2-3+ edema/lymphedema unchanged from baseline according to patient. Skin: No petechiae. Psychiatry: Judgement and insight appear normal. Mood & affect appropriate.    Data Reviewed: I have personally reviewed following labs and imaging studies  CBC: Recent Labs  Lab 09/21/20 1029 09/22/20 0603  WBC 23.3* 22.0*  NEUTROABS 19.3*  --   HGB 13.1 11.6*  HCT 40.6 36.9  MCV 92.9 94.6  PLT 565* 493*    Basic Metabolic Panel: Recent Labs  Lab 09/21/20 1029 09/21/20 1428 09/22/20 0603  NA 134*  --  134*  K 3.6  --  4.0  CL 100  --  103  CO2 21*  --  18*  GLUCOSE 119*  --  122*  BUN 57*  --  57*  CREATININE 1.59*  --  1.32*  CALCIUM 10.3  --  10.1  MG  --  2.3  --   PHOS  --  4.7*  --     GFR: Estimated Creatinine Clearance: 68.8 mL/min (A) (by C-G formula based on SCr of 1.32 mg/dL (H)).  Liver Function Tests: Recent Labs  Lab 09/21/20 1029  AST 38  ALT 30  ALKPHOS 225*  BILITOT 2.4*  PROT 7.9  ALBUMIN 2.8*    CBG: Recent Labs  Lab 09/22/20 2029 09/23/20 0736 09/23/20 1125 09/23/20 1644  GLUCAP 186* 152* 175* 130*     Recent Results (from the past 240 hour(s))  SARS CORONAVIRUS 2 (TAT 6-24 HRS)  Nasopharyngeal Nasopharyngeal Swab     Status: None   Collection Time: 09/21/20  4:09 PM   Specimen: Nasopharyngeal Swab  Result Value Ref Range Status   SARS Coronavirus 2 NEGATIVE NEGATIVE Final    Comment: (NOTE) SARS-CoV-2 target nucleic acids are NOT DETECTED.  The SARS-CoV-2 RNA is generally detectable in upper and lower respiratory specimens during the acute phase of infection. Negative results do not preclude SARS-CoV-2 infection, do not rule out co-infections with other pathogens, and should not be used as the sole basis for treatment or other patient management decisions. Negative results must be combined with clinical observations, patient history, and epidemiological information. The expected result is Negative.  Fact Sheet for  Patients: HairSlick.no  Fact Sheet for Healthcare Providers: quierodirigir.com  This test is not yet approved or cleared by the [place] FDA and  has been authorized for detection and/or diagnosis of SARS-CoV-2 by FDA under an Emergency Use Authorization (EUA). This EUA will remain  in effect (meaning this test can be used) for the duration of the COVID-19 declaration under Se ction 564(b)(1) of the Act, 21 U.S.C. section 360bbb-3(b)(1), unless the authorization is terminated or revoked sooner.  Performed at [facility], [address], [place], [place] [address]      Radiology Studies: No results found.  Scheduled Meds: . aspirin EC  81 mg Oral Daily  . gabapentin  100 mg Oral BID  . heparin injection (subcutaneous)  5,000 Units Subcutaneous Q8H  . insulin aspart  0-5 Units Subcutaneous QHS  . insulin aspart  0-9 Units Subcutaneous TID WC  . methylPREDNISolone (SOLU-MEDROL) injection  40 mg Intravenous Q12H  . metoprolol tartrate  25 mg Oral BID  . nicotine  14 mg Transdermal Daily  . NIFEdipine  90 mg Oral Daily  . pantoprazole  40 mg Oral Daily   Continuous Infusions: . methocarbamol (ROBAXIN) IV       LOS: 1 day    Time spent: 35 minutes   Vassie Loll, MD [facility]   To contact the attending provider between 7A-7P or the covering provider during after hours 7P-7A, please log into the web site [email or web address] and access using universal [place] password for that web site. If you do not have the password, please call the hospital operator.  09/23/2020, 5:24 PM

## 2020-09-24 DIAGNOSIS — M545 Low back pain, unspecified: Secondary | ICD-10-CM

## 2020-09-24 LAB — GLUCOSE, CAPILLARY
Glucose-Capillary: 142 mg/dL — ABNORMAL HIGH (ref 70–99)
Glucose-Capillary: 150 mg/dL — ABNORMAL HIGH (ref 70–99)
Glucose-Capillary: 163 mg/dL — ABNORMAL HIGH (ref 70–99)
Glucose-Capillary: 151 mg/dL — ABNORMAL HIGH (ref 70–99)

## 2020-09-24 LAB — BASIC METABOLIC PANEL
Anion gap: 8 (ref 5–15)
BUN: 57 mg/dL — ABNORMAL HIGH (ref 8–23)
CO2: 23 mmol/L (ref 22–32)
Calcium: 9.5 mg/dL (ref 8.9–10.3)
Chloride: 105 mmol/L (ref 98–111)
Creatinine, Ser: 0.96 mg/dL (ref 0.44–1.00)
GFR, Estimated: >60 mL/min (ref >60)
Glucose, Bld: 129 mg/dL — ABNORMAL HIGH (ref 70–99)
Potassium: 4.2 mmol/L (ref 3.5–5.1)
Sodium: 136 mmol/L (ref 135–145)

## 2020-09-24 LAB — CBC
HCT: 35.9 % — ABNORMAL LOW (ref 36.0–46.0)
Hemoglobin: 11.2 g/dL — ABNORMAL LOW (ref 12.0–15.0)
MCH: 29.5 pg (ref 26.0–34.0)
MCHC: 31.2 g/dL (ref 30.0–36.0)
MCV: 94.5 fL (ref 80.0–100.0)
Platelets: 537 10*3/uL — ABNORMAL HIGH (ref 150–400)
RBC: 3.8 MIL/uL — ABNORMAL LOW (ref 3.87–5.11)
RDW: 14.6 % (ref 11.5–15.5)
WBC: 18.6 10*3/uL — ABNORMAL HIGH (ref 4.0–10.5)
nRBC: 0 % (ref 0.0–0.2)

## 2020-09-24 NOTE — Progress Notes (Signed)
PROGRESS NOTE    [name]  QZE:[id number] DOB: 1961-06-07 DOA: 09/21/2020 PCP: [facility]   Brief Narrative:  DEETRA BOOTON a 60 y.o.femalewith medical history significant ofhypertension, morbid obesity, tobacco abuse, COPD, history of gout and gastroesophageal reflux disease; who is presented to the hospital secondary to worsening lower back pain and inability to walk. Patient reports back pain has been present for the last 2 to 82-month but in the last 2 weeks has gotten severely worse and is impending here to ambulate. Patient reports some mild intermittent sciatica symptoms, expressed pain to be dull and constant in the lower aspect of her back. Physical exertion and ambulation makes it worse. Patient denies fever, chills, abdominal pain, melena, hematochezia, dysuria, stools or urine incontinence, chest pain, shortness of breath, nausea, vomiting or sick contacts. She continues to smoke.  Still having difficulties with intermittent low back pain requiring IV analgesics and with ongoing therapy using IV steroids.  Plan: In need for skilled nursing facility at time of discharge by physical therapy.  Continue current therapy and follow clinical response.  Outpatient follow-up with neurosurgery has been recommended.  Patient's obesity/body habitus also contributing in her ongoing symptoms and inability for ambulation.  Assessment & Plan:   Principal Problem:   Back pain Active Problems:   COPD (chronic obstructive pulmonary disease) (HCC)   Hypertension   Obesity   Gout   1-Acute on chronic back pain and inability to walk -Images demonstrating extensive degenerative changes and significant narrowing of the spinal canal. -Patient continued reporting intermittent sciatica symptoms, but is slightly better. -No urinary incontinence or retention symptoms; patient also denies any stool incontinence. -Patient is now reporting numbness in her lower  extremities. -Case discussed with neurosurgery who recommended continue conservative management and outpatient follow-up. -Will continue IV steroids, as needed analgesics and supportive care. -Continue the use of Neurontin and physical therapy. -Physical therapy evaluation recommending a skilled nursing facility and rolling walker; patient has now reconsidered SNF  2-morbid obesity -Body mass index is 53.24 kg/m. -Low calorie diet, portion control, increase physical activity lifestyle modification discussed with patient. -Patient will benefit of outpatient follow-up with bariatric clinic.  3-tobacco abuse -Cessation counseling provided -Continue nicotine patch.  4-history of COPD -No shortness of breath, no wheezing no tachypnea appreciated -Continue as needed bronchodilators. -As mentioned above tobacco cessation counseling has been provided.  5-acute kidney injury-resolved -Continue minimizing the use of nephrotoxic agents -good response to gentle fluid resuscitation -Cr down to 0.9 currently -will follow trend -advise to maintain adequate hydration.  6-hyperglycemia-improved -Without prior history of diabetes; records indicating last A1c in the pre-diabetic range (5.9). -Steroids most likely causing demargination and increase CBGs -A1c 5.6 currently  7-leukocytosis-improving -Likely steroid induced -No signs of infection so we will continue holding on antibiotics. -Follow-up WBCs trend  8-hypertension -Continue current antihypertensive regimen -Follow vital signs.  DVT prophylaxis: Heparin Code Status: Full code Family Communication: No family at bedside.  Patient expressed that she will contact her son and provide updates of her condition. Disposition:   Status is: Inpatient  Dispo: The patient is from: home  Anticipated d/c is to: SNF  Anticipated d/c date is: 1 day or so  Patient currently no medically stable for  discharge; still requiring IV analgesics and IV steroids.  Followed by physical therapy and seen high risk for falls and in need for rehabilitation at a skilled nursing facility after discharge.  Difficult to place patient not yet    Consultants:   Neurosurgery (Dr. Lovell Sheehan) curbside.  No need for acute surgical intervention at this moment.  Continue conservative management with steroids, analgesics and physical therapy.  Outpatient follow-up with neurosurgery as an outpatient recommended.   Procedures:  See below for x-ray report.   Antimicrobials:  None  Subjective: Patient seen and evaluated today with no new acute complaints or concerns. No acute concerns or events noted overnight. She states that her back is feeling much better, but she still cannot stand or bear weight.  Objective: Vitals:   09/23/20 0438 09/23/20 0821 09/23/20 1030 09/23/20 2128  BP: (!) 112/53 125/65  135/60  Pulse: 68 78 62 72  Resp: 16 16  19   Temp: 98 F (36.7 C)   98.1 F (36.7 C)  TempSrc: Oral   Oral  SpO2: 96% 98% 97% 94%  Weight:      Height:        Intake/Output Summary (Last 24 hours) at 09/24/2020 1514 Last data filed at 09/24/2020 1500 Gross per 24 hour  Intake 480 ml  Output 1000 ml  Net -520 ml   Filed Weights   09/21/20 0950 09/21/20 1730  Weight: (!) 154.2 kg (!) 154.2 kg    Examination:  General exam: Appears calm and comfortable, obese Respiratory system: Clear to auscultation. Respiratory effort normal. Cardiovascular system: S1 & S2 heard, RRR.  Gastrointestinal system: Abdomen is soft Central nervous system: Alert and awake Extremities: No edema Skin: No significant lesions noted Psychiatry: Flat affect.    Data Reviewed: I have personally reviewed following labs and imaging studies  CBC: Recent Labs  Lab 09/21/20 1029 09/22/20 0603 09/24/20 0400  WBC 23.3* 22.0* 18.6*  NEUTROABS 19.3*  --   --   HGB 13.1 11.6* 11.2*  HCT 40.6 36.9 35.9*   MCV 92.9 94.6 94.5  PLT 565* 493* 537*   Basic Metabolic Panel: Recent Labs  Lab 09/21/20 1029 09/21/20 1428 09/22/20 0603 09/24/20 0400  NA 134*  --  134* 136  K 3.6  --  4.0 4.2  CL 100  --  103 105  CO2 21*  --  18* 23  GLUCOSE 119*  --  122* 129*  BUN 57*  --  57* 57*  CREATININE 1.59*  --  1.32* 0.96  CALCIUM 10.3  --  10.1 9.5  MG  --  2.3  --   --   PHOS  --  4.7*  --   --    GFR: Estimated Creatinine Clearance: 94.6 mL/min (by C-G formula based on SCr of 0.96 mg/dL). Liver Function Tests: Recent Labs  Lab 09/21/20 1029  AST 38  ALT 30  ALKPHOS 225*  BILITOT 2.4*  PROT 7.9  ALBUMIN 2.8*   No results for input(s): LIPASE, AMYLASE in the last 168 hours. No results for input(s): AMMONIA in the last 168 hours. Coagulation Profile: No results for input(s): INR, PROTIME in the last 168 hours. Cardiac Enzymes: No results for input(s): CKTOTAL, CKMB, CKMBINDEX, TROPONINI in the last 168 hours. BNP (last 3 results) No results for input(s): PROBNP in the last 8760 hours. HbA1C: Recent Labs    09/22/20 0603  HGBA1C 5.6   CBG: Recent Labs  Lab 09/23/20 1125 09/23/20 1644 09/23/20 2047 09/24/20 0721 09/24/20 1113  GLUCAP 175* 130* 229* 142* 150*   Lipid Profile: No results for input(s): CHOL, HDL, LDLCALC, TRIG, CHOLHDL, LDLDIRECT in the last 72 hours. Thyroid Function Tests: No results  for input(s): TSH, T4TOTAL, FREET4, T3FREE, THYROIDAB in the last 72 hours. Anemia Panel: No results for input(s): VITAMINB12, FOLATE, FERRITIN, TIBC, IRON, RETICCTPCT in the last 72 hours. Sepsis Labs: No results for input(s): PROCALCITON, LATICACIDVEN in the last 168 hours.  Recent Results (from the past 240 hour(s))  SARS CORONAVIRUS 2 (TAT 6-24 HRS) Nasopharyngeal Nasopharyngeal Swab     Status: None   Collection Time: 09/21/20  4:09 PM   Specimen: Nasopharyngeal Swab  Result Value Ref Range Status   SARS Coronavirus 2 NEGATIVE NEGATIVE Final    Comment:  (NOTE) SARS-CoV-2 target nucleic acids are NOT DETECTED.  The SARS-CoV-2 RNA is generally detectable in upper and lower respiratory specimens during the acute phase of infection. Negative results do not preclude SARS-CoV-2 infection, do not rule out co-infections with other pathogens, and should not be used as the sole basis for treatment or other patient management decisions. Negative results must be combined with clinical observations, patient history, and epidemiological information. The expected result is Negative.  Fact Sheet for Patients: HairSlick.no  Fact Sheet for Healthcare Providers: quierodirigir.com  This test is not yet approved or cleared by the [place] FDA and  has been authorized for detection and/or diagnosis of SARS-CoV-2 by FDA under an Emergency Use Authorization (EUA). This EUA will remain  in effect (meaning this test can be used) for the duration of the COVID-19 declaration under Se ction 564(b)(1) of the Act, 21 U.S.C. section 360bbb-3(b)(1), unless the authorization is terminated or revoked sooner.  Performed at [facility], [address], [place], [place] [address]          Radiology Studies: No results found.      Scheduled Meds: . aspirin EC  81 mg Oral Daily  . gabapentin  100 mg Oral BID  . heparin injection (subcutaneous)  5,000 Units Subcutaneous Q8H  . insulin aspart  0-5 Units Subcutaneous QHS  . insulin aspart  0-9 Units Subcutaneous TID WC  . methylPREDNISolone (SOLU-MEDROL) injection  40 mg Intravenous Q12H  . metoprolol tartrate  25 mg Oral BID  . nicotine  14 mg Transdermal Daily  . NIFEdipine  90 mg Oral Daily  . pantoprazole  40 mg Oral Daily   Continuous Infusions: . methocarbamol (ROBAXIN) IV       LOS: 2 days    Time spent: 35 minutes    Laiah Pouncey Hoover Brunette, DO [facility]  If 7PM-7AM, please contact  night-coverage [email or web address] 09/24/2020, 3:14 PM

## 2020-09-24 NOTE — TOC Progression Note (Signed)
Transition of Care [facility]) - Progression Note    Patient Details  Name: [name] MRN: [id number] Date of Birth: [birth year]  Transition [facility]) CM/SW Contact  Karn Cassis, [place] Phone Number: 09/24/2020, 3:58 PM  Clinical Narrative:  Pt states she needs SNF and is unable to go home. LCSW followed up with [facility]. Debbie checking to see if pt has copays. Awaiting response. TOC will follow up in AM.      Expected Discharge Plan: Home w Home Health Services Barriers to Discharge: Continued Medical Work up  Expected Discharge Plan and Services Expected Discharge Plan: Home w Home Health Services                                   HH Arranged: PT   Date [facility] Agency Contacted: 09/23/20 Time HH Agency Contacted: [phone] Representative spoke with at [facility] Agency: Denyse Amass   Social Determinants of Health (SDOH) Interventions    Readmission Risk Interventions No flowsheet data found.

## 2020-09-24 NOTE — Plan of Care (Signed)
  Problem: Education: Goal: Knowledge of General Education information will improve Description Including pain rating scale, medication(s)/side effects and non-pharmacologic comfort measures Outcome: Progressing   Problem: Health Behavior/Discharge Planning: Goal: Ability to manage health-related needs will improve Outcome: Progressing   

## 2020-09-25 LAB — BASIC METABOLIC PANEL
Anion gap: 10 (ref 5–15)
BUN: 51 mg/dL — ABNORMAL HIGH (ref 8–23)
CO2: 26 mmol/L (ref 22–32)
Calcium: 9.7 mg/dL (ref 8.9–10.3)
Chloride: 105 mmol/L (ref 98–111)
Creatinine, Ser: 0.96 mg/dL (ref 0.44–1.00)
GFR, Estimated: >60 mL/min (ref >60)
Glucose, Bld: 163 mg/dL — ABNORMAL HIGH (ref 70–99)
Potassium: 4.9 mmol/L (ref 3.5–5.1)
Sodium: 141 mmol/L (ref 135–145)

## 2020-09-25 LAB — CBC
HCT: 38.1 % (ref 36.0–46.0)
Hemoglobin: 11.8 g/dL — ABNORMAL LOW (ref 12.0–15.0)
MCH: 29.6 pg (ref 26.0–34.0)
MCHC: 31 g/dL (ref 30.0–36.0)
MCV: 95.7 fL (ref 80.0–100.0)
Platelets: 551 10*3/uL — ABNORMAL HIGH (ref 150–400)
RBC: 3.98 MIL/uL (ref 3.87–5.11)
RDW: 14.4 % (ref 11.5–15.5)
WBC: 14.1 10*3/uL — ABNORMAL HIGH (ref 4.0–10.5)
nRBC: 0 % (ref 0.0–0.2)

## 2020-09-25 LAB — GLUCOSE, CAPILLARY
Glucose-Capillary: 130 mg/dL — ABNORMAL HIGH (ref 70–99)
Glucose-Capillary: 137 mg/dL — ABNORMAL HIGH (ref 70–99)
Glucose-Capillary: 155 mg/dL — ABNORMAL HIGH (ref 70–99)
Glucose-Capillary: 232 mg/dL — ABNORMAL HIGH (ref 70–99)

## 2020-09-25 LAB — RESP PANEL BY RT-PCR (FLU A&B, COVID) ARPGX2
Influenza A by PCR: NEGATIVE
Influenza B by PCR: NEGATIVE
SARS Coronavirus 2 by RT PCR: NEGATIVE

## 2020-09-25 MED ORDER — HYDROCODONE-ACETAMINOPHEN 5-325 MG PO TABS: 1.0000 | ORAL_TABLET | Freq: Four times a day (QID) | ORAL | 0 refills | Status: AC | PRN

## 2020-09-25 MED ORDER — METHOCARBAMOL 500 MG PO TABS: 500.0000 mg | ORAL_TABLET | Freq: Three times a day (TID) | ORAL | 0 refills | Status: AC | PRN

## 2020-09-25 MED ORDER — PREDNISONE 10 MG PO TABS: 40.0000 mg | ORAL_TABLET | Freq: Every day | ORAL | 0 refills | Status: AC

## 2020-09-25 MED ORDER — METOPROLOL TARTRATE 25 MG PO TABS: 25.0000 mg | ORAL_TABLET | Freq: Two times a day (BID) | ORAL | 0 refills | Status: AC

## 2020-09-25 MED ORDER — GABAPENTIN 100 MG PO CAPS: 100.0000 mg | ORAL_CAPSULE | Freq: Two times a day (BID) | ORAL | 0 refills | Status: AC

## 2020-09-25 NOTE — TOC Progression Note (Signed)
Transition of Care [facility]) - Progression Note    Patient Details  Name: [name] MRN: [id number] Date of Birth: 1960/11/04  Transition of Care [facility]) CM/SW Contact  Leitha Bleak, RN Phone Number: 09/25/2020, 4:17 PM  Clinical Narrative:   [facility] made a bed offer, Patient is accepting. Patient has not meet her deductible, will need to write a check for $1500 to [facility]. Patient is agreeable. [facility] is starting INS AUTH. Patient can DC tomorrow. MD updated.     Expected Discharge Plan: Home w Home Health Services Barriers to Discharge: Continued Medical Work up  Expected Discharge Plan and Services Expected Discharge Plan: Home w Home Health Services

## 2020-09-25 NOTE — Progress Notes (Signed)
Physical Therapy Treatment Patient Details Name: [name] MRN: [id number] DOB: 1961/06/02 Today's Date: 09/25/2020    History of Present Illness [name] is a 60 y.o. female with medical history significant of hypertension, morbid obesity, tobacco abuse, COPD, history of gout and gastroesophageal reflux disease; who is presented to the hospital secondary to worsening lower back pain and inability to walk.  Patient reports back pain has been present for the last 2 to 46-month but in the last 2 weeks has gotten severely worse and is impending here to ambulate.  Patient reports some mild intermittent sciatica symptoms, expressed pain to be dull and constant in the lower aspect of her back.  Physical exertion and ambulation makes it worse.  Patient denies fever, chills, abdominal pain, melena, hematochezia, dysuria, stools or urine incontinence, chest pain, shortness of breath, nausea, vomiting or sick contacts.  She continues to smoke.    PT Comments    Patient demonstrates fair good return for sitting up at bedside with bed flat, had to use sider rail and required increased time.  Patient demonstrates good return for completing BLE ROM/strengthening exercises while seated at bedside, slightly limited for reaching full ROM when completing marching in place due to weakness, able to slightly lift bottom off bed with bed elevated with most of body weight supported by BUE during trails of sit to stands and unable to lock knees due to weakness.  Patient tolerated sitting up at bedside after therapy - nursing staff notified.  Patient will benefit from continued physical therapy in hospital and recommended venue below to increase strength, balance, endurance for safe ADLs and gait.    Follow Up Recommendations  SNF     Equipment Recommendations  Rolling walker with 5" wheels;3in1 (PT)    Recommendations for Other Services       Precautions / Restrictions Precautions Precautions:  Fall Restrictions Weight Bearing Restrictions: No    Mobility  Bed Mobility Overal bed mobility: Needs Assistance Bed Mobility: Supine to Sit     Supine to sit: Supervision;Min guard     General bed mobility comments: increased time, labored movement with use of bed rail    Transfers Overall transfer level: Needs assistance Equipment used: Rolling walker (2 wheeled) Transfers: Sit to/from Stand Sit to Stand: Max assist;From elevated surface         General transfer comment: able to partially lift bottom off bed with bed elevated and Max assist  Ambulation/Gait                 Stairs             Wheelchair Mobility    Modified Rankin (Stroke Patients Only)       Balance Overall balance assessment: Needs assistance Sitting-balance support: Feet supported;No upper extremity supported Sitting balance-Leahy Scale: Good Sitting balance - Comments: seated at EOB   Standing balance support: During functional activity;Bilateral upper extremity supported Standing balance-Leahy Scale: Zero Standing balance comment: using RW                            Cognition Arousal/Alertness: Awake/alert Behavior During Therapy: WFL for tasks assessed/performed Overall Cognitive Status: Within Functional Limits for tasks assessed                                        Exercises General Exercises -  Lower Extremity Long Arc Quad: Seated;AROM;Strengthening;Both;15 reps Hip Flexion/Marching: Seated;Strengthening;Both;15 reps Toe Raises: Seated;Strengthening;Both;20 reps Heel Raises: Seated;Strengthening;Both;20 reps    General Comments        Pertinent Vitals/Pain Pain Assessment: No/denies pain    Home Living                      Prior Function            PT Goals (current goals can now be found in the care plan section) Acute Rehab PT Goals Patient Stated Goal: return home after rehab PT Goal Formulation: With  patient Time For Goal Achievement: 10/06/20 Potential to Achieve Goals: Good Progress towards PT goals: Progressing toward goals    Frequency    Min 3X/week      PT Plan Current plan remains appropriate    Co-evaluation              AM-PAC PT "6 Clicks" Mobility   Outcome Measure  Help needed turning from your back to your side while in a flat bed without using bedrails?: A Little Help needed moving from lying on your back to sitting on the side of a flat bed without using bedrails?: A Little Help needed moving to and from a bed to a chair (including a wheelchair)?: Total Help needed standing up from a chair using your arms (e.g., wheelchair or bedside chair)?: Total Help needed to walk in hospital room?: Total Help needed climbing 3-5 steps with a railing? : Total 6 Click Score: 10    End of Session Equipment Utilized During Treatment: Gait belt Activity Tolerance: Patient tolerated treatment well;Patient limited by fatigue Patient left: in bed;with call bell/phone within reach Nurse Communication: Mobility status PT Visit Diagnosis: Unsteadiness on feet (R26.81);Other abnormalities of gait and mobility (R26.89);Muscle weakness (generalized) (M62.81)     Time: 6195-0932 PT Time Calculation (min) (ACUTE ONLY): 30 min  Charges:  $Therapeutic Exercise: 8-22 mins $Therapeutic Activity: 8-22 mins                     4:02 PM, 09/25/20 Ocie Bob, MPT Physical Therapist with [facility] [phone] office [phone] mobile phone

## 2020-09-25 NOTE — Progress Notes (Signed)
PROGRESS NOTE    [name]  CWC:[id number] DOB: 09-03-1960 DOA: 09/21/2020 PCP: [facility]   Brief Narrative:  [name] a 60 y.o.femalewith medical history significant ofhypertension, morbid obesity, tobacco abuse, COPD, history of gout and gastroesophageal reflux disease; who is presented to the hospital secondary to worsening lower back pain and inability to walk. Patient reports back pain has been present for the last 2 to 15-month but in the last 2 weeks has gotten severely worse and is impending here to ambulate. Patient reports some mild intermittent sciatica symptoms, expressed pain to be dull and constant in the lower aspect of her back. Physical exertion and ambulation makes it worse. Patient denies fever, chills, abdominal pain, melena, hematochezia, dysuria, stools or urine incontinence, chest pain, shortness of breath, nausea, vomiting or sick contacts. She continues to smoke.  Still having difficulties with intermittent low back pain requiring IV analgesics and with ongoing therapy using IV steroids. Plan: In need for skilled nursing facility at time of discharge by physical therapy. Continue current therapy and follow clinical response. Outpatient follow-up with neurosurgery has been recommended. Patient's obesity/body habitus also contributing in her ongoing symptoms and inability for ambulation.  Assessment & Plan:   Principal Problem:   Back pain Active Problems:   COPD (chronic obstructive pulmonary disease) (HCC)   Hypertension   Obesity   Gout   1-Acute on chronic back pain and inability to walk -Images demonstrating extensive degenerative changes and significant narrowing of the spinal canal. -Patientcontinuedreporting intermittent sciatica symptoms,but is slightly better. -No urinary incontinence or retention symptoms; patient also denies any stool incontinence. -Patient is now reportingnumbness in her lower  extremities. -Case discussed with neurosurgery who recommended continue conservative management and outpatient follow-up. -Will continue IV steroids, as needed analgesics and supportive care. -Continue the use of Neurontin and physical therapy. -Physical therapy evaluation recommending a skilled nursing facility and rolling walker; patient has now reconsidered SNF  2-morbid obesity -Body mass index is 53.24 kg/m. -Low calorie diet, portion control, increase physical activity lifestyle modification discussed with patient. -Patient will benefit of outpatient follow-up with bariatric clinic.  3-tobacco abuse -Cessation counseling provided -Continue nicotine patch.  4-history ofCOPD -No shortness of breath, no wheezing no tachypnea appreciated -Continue as needed bronchodilators. -As mentioned above tobacco cessation counseling has been provided.  5-acute kidney injury-resolved -Continue minimizing the use of nephrotoxic agents -good response to gentle fluid resuscitation -Cr down to 0.9 currently -will follow trend -advise to maintain adequate hydration.  6-hyperglycemia-improved -Without prior history of diabetes; records indicating last A1c in thepre-diabetic range(5.9). -Steroids most likely causing demargination and increase CBGs -A1c 5.6 currently  7-leukocytosis-improving -Likely steroid induced -No signs of infection so we will continue holding on antibiotics. -Follow-up WBCs trend  8-hypertension -Continue current antihypertensive regimen -Follow vital signs.  DVT prophylaxis:Heparin Code Status:Full code Family Communication:No family at bedside. Patient expressed that she will contact her son and provide updates of her condition. Disposition:  Status is: Inpatient  Dispo: The patient is from: home Anticipated d/c is to: SNF Anticipated d/c date is: 1day or so Patient currently no medically stable for  discharge; still requiring IV analgesics and IV steroids. Followed by physical therapy and seen high risk for falls and in need for rehabilitation at a skilled nursing facility after discharge. Difficult to place patient not yet   Consultants:  Neurosurgery (Dr. Lovell Sheehan) curbside. No need for acute surgical intervention at this moment. Continue conservative management with steroids, analgesics and  physical therapy. Outpatient follow-up with neurosurgery as an outpatient recommended.   Procedures: See below for x-ray report.   Antimicrobials:  None  Subjective: Patient seen and evaluated today with no new acute complaints or concerns. No acute concerns or events noted overnight. She states her back pain is better, but she cannot get up to walk.  Objective: Vitals:   09/23/20 2128 09/24/20 2147 09/25/20 0510 09/25/20 1358  BP: 135/60 138/69 135/75 108/62  Pulse: 72 61 60 74  Resp: 19 20 (!) 21 17  Temp: 98.1 F (36.7 C) 97.6 F (36.4 C) 97.9 F (36.6 C) 98.3 F (36.8 C)  TempSrc: Oral Oral Oral Oral  SpO2: 94% 98% 96% 100%  Weight:      Height:        Intake/Output Summary (Last 24 hours) at 09/25/2020 1632 Last data filed at 09/25/2020 0900 Gross per 24 hour  Intake 480 ml  Output 600 ml  Net -120 ml   Filed Weights   09/21/20 0950 09/21/20 1730  Weight: (!) 154.2 kg (!) 154.2 kg    Examination:  General exam: Appears calm and comfortable, obese Respiratory system: Clear to auscultation. Respiratory effort normal. Cardiovascular system: S1 & S2 heard, RRR.  Gastrointestinal system: Abdomen is soft Central nervous system: Alert and awake Extremities: No edema Skin: No significant lesions noted Psychiatry: Flat affect.    Data Reviewed: I have personally reviewed following labs and imaging studies  CBC: Recent Labs  Lab 09/21/20 1029 09/22/20 0603 09/24/20 0400 09/25/20 0351  WBC 23.3* 22.0* 18.6* 14.1*  NEUTROABS 19.3*  --   --    --   HGB 13.1 11.6* 11.2* 11.8*  HCT 40.6 36.9 35.9* 38.1  MCV 92.9 94.6 94.5 95.7  PLT 565* 493* 537* 551*   Basic Metabolic Panel: Recent Labs  Lab 09/21/20 1029 09/21/20 1428 09/22/20 0603 09/24/20 0400 09/25/20 0351  NA 134*  --  134* 136 141  K 3.6  --  4.0 4.2 4.9  CL 100  --  103 105 105  CO2 21*  --  18* 23 26  GLUCOSE 119*  --  122* 129* 163*  BUN 57*  --  57* 57* 51*  CREATININE 1.59*  --  1.32* 0.96 0.96  CALCIUM 10.3  --  10.1 9.5 9.7  MG  --  2.3  --   --   --   PHOS  --  4.7*  --   --   --    GFR: Estimated Creatinine Clearance: 94.6 mL/min (by C-G formula based on SCr of 0.96 mg/dL). Liver Function Tests: Recent Labs  Lab 09/21/20 1029  AST 38  ALT 30  ALKPHOS 225*  BILITOT 2.4*  PROT 7.9  ALBUMIN 2.8*   No results for input(s): LIPASE, AMYLASE in the last 168 hours. No results for input(s): AMMONIA in the last 168 hours. Coagulation Profile: No results for input(s): INR, PROTIME in the last 168 hours. Cardiac Enzymes: No results for input(s): CKTOTAL, CKMB, CKMBINDEX, TROPONINI in the last 168 hours. BNP (last 3 results) No results for input(s): PROBNP in the last 8760 hours. HbA1C: No results for input(s): HGBA1C in the last 72 hours. CBG: Recent Labs  Lab 09/24/20 1628 09/24/20 2025 09/25/20 0723 09/25/20 1104 09/25/20 1520  GLUCAP 163* 151* 130* 137* 155*   Lipid Profile: No results for input(s): CHOL, HDL, LDLCALC, TRIG, CHOLHDL, LDLDIRECT in the last 72 hours. Thyroid Function Tests: No results for input(s): TSH, T4TOTAL, FREET4, T3FREE, THYROIDAB in the  last 72 hours. Anemia Panel: No results for input(s): VITAMINB12, FOLATE, FERRITIN, TIBC, IRON, RETICCTPCT in the last 72 hours. Sepsis Labs: No results for input(s): PROCALCITON, LATICACIDVEN in the last 168 hours.  Recent Results (from the past 240 hour(s))  SARS CORONAVIRUS 2 (TAT 6-24 HRS) Nasopharyngeal Nasopharyngeal Swab     Status: None   Collection Time: 09/21/20   4:09 PM   Specimen: Nasopharyngeal Swab  Result Value Ref Range Status   SARS Coronavirus 2 NEGATIVE NEGATIVE Final    Comment: (NOTE) SARS-CoV-2 target nucleic acids are NOT DETECTED.  The SARS-CoV-2 RNA is generally detectable in upper and lower respiratory specimens during the acute phase of infection. Negative results do not preclude SARS-CoV-2 infection, do not rule out co-infections with other pathogens, and should not be used as the sole basis for treatment or other patient management decisions. Negative results must be combined with clinical observations, patient history, and epidemiological information. The expected result is Negative.  Fact Sheet for Patients: HairSlick.no  Fact Sheet for Healthcare Providers: quierodirigir.com  This test is not yet approved or cleared by the [place] FDA and  has been authorized for detection and/or diagnosis of SARS-CoV-2 by FDA under an Emergency Use Authorization (EUA). This EUA will remain  in effect (meaning this test can be used) for the duration of the COVID-19 declaration under Se ction 564(b)(1) of the Act, 21 U.S.C. section 360bbb-3(b)(1), unless the authorization is terminated or revoked sooner.  Performed at [facility], [address], [place], [place] [address]          Radiology Studies: No results found.      Scheduled Meds: . aspirin EC  81 mg Oral Daily  . gabapentin  100 mg Oral BID  . heparin injection (subcutaneous)  5,000 Units Subcutaneous Q8H  . insulin aspart  0-5 Units Subcutaneous QHS  . insulin aspart  0-9 Units Subcutaneous TID WC  . methylPREDNISolone (SOLU-MEDROL) injection  40 mg Intravenous Q12H  . metoprolol tartrate  25 mg Oral BID  . nicotine  14 mg Transdermal Daily  . NIFEdipine  90 mg Oral Daily  . pantoprazole  40 mg Oral Daily   Continuous Infusions: . methocarbamol (ROBAXIN) IV       LOS: 3 days    Time  spent: 35 minutes    Deanna Sellin Hoover Brunette, DO [facility]  If 7PM-7AM, please contact night-coverage [email or web address] 09/25/2020, 4:32 PM

## 2020-09-26 LAB — CBC
HCT: 37.9 % (ref 36.0–46.0)
Hemoglobin: 11.5 g/dL — ABNORMAL LOW (ref 12.0–15.0)
MCH: 29.3 pg (ref 26.0–34.0)
MCHC: 30.3 g/dL (ref 30.0–36.0)
MCV: 96.4 fL (ref 80.0–100.0)
Platelets: 522 10*3/uL — ABNORMAL HIGH (ref 150–400)
RBC: 3.93 MIL/uL (ref 3.87–5.11)
RDW: 14.3 % (ref 11.5–15.5)
WBC: 14.2 10*3/uL — ABNORMAL HIGH (ref 4.0–10.5)
nRBC: 0 % (ref 0.0–0.2)

## 2020-09-26 LAB — GLUCOSE, CAPILLARY
Glucose-Capillary: 157 mg/dL — ABNORMAL HIGH (ref 70–99)
Glucose-Capillary: 143 mg/dL — ABNORMAL HIGH (ref 70–99)
Glucose-Capillary: 162 mg/dL — ABNORMAL HIGH (ref 70–99)
Glucose-Capillary: 134 mg/dL — ABNORMAL HIGH (ref 70–99)

## 2020-09-26 LAB — BASIC METABOLIC PANEL
Anion gap: 7 (ref 5–15)
BUN: 42 mg/dL — ABNORMAL HIGH (ref 8–23)
CO2: 26 mmol/L (ref 22–32)
Calcium: 9.2 mg/dL (ref 8.9–10.3)
Chloride: 104 mmol/L (ref 98–111)
Creatinine, Ser: 0.91 mg/dL (ref 0.44–1.00)
GFR, Estimated: >60 mL/min (ref >60)
Glucose, Bld: 146 mg/dL — ABNORMAL HIGH (ref 70–99)
Potassium: 4.6 mmol/L (ref 3.5–5.1)
Sodium: 137 mmol/L (ref 135–145)

## 2020-09-26 LAB — MAGNESIUM: Magnesium: 1.9 mg/dL (ref 1.7–2.4)

## 2020-09-26 NOTE — Progress Notes (Signed)
Physical Therapy Treatment Patient Details Name: [name] MRN: [id number] DOB: 1961-02-08 Today's Date: 09/26/2020    History of Present Illness [name] is a 60 y.o. female with medical history significant of hypertension, morbid obesity, tobacco abuse, COPD, history of gout and gastroesophageal reflux disease; who is presented to the hospital secondary to worsening lower back pain and inability to walk.  Patient reports back pain has been present for the last 2 to 48-month but in the last 2 weeks has gotten severely worse and is impending here to ambulate.  Patient reports some mild intermittent sciatica symptoms, expressed pain to be dull and constant in the lower aspect of her back.  Physical exertion and ambulation makes it worse.  Patient denies fever, chills, abdominal pain, melena, hematochezia, dysuria, stools or urine incontinence, chest pain, shortness of breath, nausea, vomiting or sick contacts.  She continues to smoke.    PT Comments    Patient demonstrates improvement for lifting bottom off bed during sit to stands with bed elevated and knees blocked.  Patient tolerated partial standing with trunk flexed for up to approximately 30-40 seconds x 3 trials before having to sit due to fatigue and bilateral knee discomfort.  Patient tolerated sitting up at bedside after therapy - RN notified.  Patient will benefit from continued physical therapy in hospital and recommended venue below to increase strength, balance, endurance for safe ADLs and gait.   Follow Up Recommendations  CIR     Equipment Recommendations  Rolling walker with 5" wheels;3in1 (PT);Hospital bed;Other (comment) (Heavy duty RW, 3in 1)    Recommendations for Other Services       Precautions / Restrictions Precautions Precautions: Fall Restrictions Weight Bearing Restrictions: No    Mobility  Bed Mobility Overal bed mobility: Needs Assistance Bed Mobility: Supine to Sit     Supine to sit: HOB  elevated;Supervision     General bed mobility comments: slightly increased time, labored movement    Transfers Overall transfer level: Needs assistance Equipment used: Rolling walker (2 wheeled) Transfers: Sit to/from Stand Sit to Stand: Max assist;From elevated surface         General transfer comment: tolerated standing for up to 30-40 seconds with knees blocked  Ambulation/Gait                 Stairs             Wheelchair Mobility    Modified Rankin (Stroke Patients Only)       Balance Overall balance assessment: Needs assistance Sitting-balance support: Feet supported;No upper extremity supported Sitting balance-Leahy Scale: Good Sitting balance - Comments: seated at EOB   Standing balance support: During functional activity;Bilateral upper extremity supported Standing balance-Leahy Scale: Poor Standing balance comment: using RW                            Cognition Arousal/Alertness: Awake/alert Behavior During Therapy: WFL for tasks assessed/performed Overall Cognitive Status: Within Functional Limits for tasks assessed                                        Exercises General Exercises - Lower Extremity Long Arc Quad: Seated;AROM;Strengthening;Both;20 reps Hip Flexion/Marching: Seated;Strengthening;Both;20 reps Toe Raises: Seated;Strengthening;Both;20 reps Heel Raises: Seated;Strengthening;Both;20 reps    General Comments        Pertinent Vitals/Pain Pain Assessment: Faces Faces Pain  Scale: Hurts a little bit Pain Location: bilateral knees when weightbearing Pain Descriptors / Indicators: Sore;Grimacing Pain Intervention(s): Limited activity within patient's tolerance;Monitored during session    Home Living                      Prior Function            PT Goals (current goals can now be found in the care plan section) Acute Rehab PT Goals Patient Stated Goal: return home after rehab PT  Goal Formulation: With patient Time For Goal Achievement: 10/06/20 Potential to Achieve Goals: Good Progress towards PT goals: Progressing toward goals    Frequency    Min 3X/week      PT Plan Current plan remains appropriate    Co-evaluation              AM-PAC PT "6 Clicks" Mobility   Outcome Measure  Help needed turning from your back to your side while in a flat bed without using bedrails?: A Little Help needed moving from lying on your back to sitting on the side of a flat bed without using bedrails?: A Little Help needed moving to and from a bed to a chair (including a wheelchair)?: Total Help needed standing up from a chair using your arms (e.g., wheelchair or bedside chair)?: A Lot Help needed to walk in hospital room?: Total Help needed climbing 3-5 steps with a railing? : Total 6 Click Score: 11    End of Session Equipment Utilized During Treatment: Gait belt Activity Tolerance: Patient tolerated treatment well;Patient limited by fatigue Patient left: in bed;with call bell/phone within reach Nurse Communication: Mobility status PT Visit Diagnosis: Unsteadiness on feet (R26.81);Other abnormalities of gait and mobility (R26.89);Muscle weakness (generalized) (M62.81)     Time: 6720-9470 PT Time Calculation (min) (ACUTE ONLY): 30 min  Charges:  $Therapeutic Exercise: 8-22 mins $Therapeutic Activity: 8-22 mins                     11:57 AM, 09/26/20 Ocie Bob, MPT Physical Therapist with [facility] [phone] office [phone] mobile phone

## 2020-09-26 NOTE — Progress Notes (Signed)
Inpatient Rehabilitation Admissions Coordinator  Inpatient rehab consult received. Patient not in need of a hospital based rehab program. Darylene Price the medical necessity for continued inpatient hospital rehab nor is there a CIR bed available to admit there. I spoke with Lurena Joiner, SW TOC. We will sign off at this time.  Ottie Glazier, RN, MSN Rehab Admissions Coordinator [phone] 09/26/2020 12:38 PM

## 2020-09-26 NOTE — TOC Transition Note (Signed)
Transition of Care [facility]) - CM/SW Discharge Note   Patient Details  Name: [name] MRN: [id number] Date of Birth: 09/24/1960  [facility]) CM/SW Contact:  Boneta Lucks, RN Phone Number: 09/26/2020, 2:26 PM   Clinical Narrative:  Patient has not met her deductible, [facility] will need $1500 up front and patient will need to pay for 30% of Bill. Patient states she can not afford to go.  PT also recommended trying CIR, after review they can not make a bed offer. Patient will need equipment to go home. Her son will take FMLA to stay with her at home. Referral made to [facility] will deliver hospital bed, 3N1, hoyer lift, wheel chair - is on back order, updated patient.  Updated Georgina Snell with [facility] home health of discharge plan. Advised patient to call PCP on Monday if not doing well at home. They can request SNF, after hospital bills she will have meet her deductible.       Final next level of care: [place] Barriers to Discharge: Barriers Resolved  Patient Goals and CMS Choice Patient states their goals for this hospitalization and ongoing recovery are:: to get better. CMS Medicare.gov Compare Post Acute Care list provided to:: Patient Choice offered to / list presented to : Patient  Discharge Placement              Patient to be transferred to facility by: EMS to home   Patient and family notified of of transfer: 09/26/20  Discharge Plan and Services    HH Arranged: PT   Date [place]: 09/23/20 Time [place]: [id number] Representative spoke with at [place]: Georgina Snell   Readmission Risk Interventions Readmission Risk Prevention Plan 09/26/2020  Post Dischage Appt Complete  Medication Screening Complete  Transportation Screening Complete  Some recent data might be hidden

## 2020-09-26 NOTE — Discharge Summary (Signed)
Physician Discharge Summary  [name] DOB: Feb 11, 1961 DOA: 09/21/2020  PCP: [facility]  Admit date: 09/21/2020  Discharge date: 09/26/2020  Admitted From:Home  Disposition:  Home  Recommendations for Outpatient Follow-up:  1. Follow up with PCP in 1-2 weeks 2. Follow-up with neurosurgery in outpatient setting as recommended 3. Continue on steroids and pain medications as prescribed below 4. Continue other home medications as below  Home Health:Yes with PT  Equipment/Devices:Wheelchair, 3n1, Hoyer  Discharge Condition:Stable  CODE STATUS: Full  Diet recommendation: Heart Healthy  Brief/Interim Summary: [name] a 60 y.o.femalewith medical history significant ofhypertension, morbid obesity, tobacco abuse, COPD, history of gout and gastroesophageal reflux disease; who is presented to the hospital secondary to worsening lower back pain and inability to walk. Patient reports back pain has been present for the last 2 to 60-month but in the last 2 weeks has gotten severely worse and is impending here to ambulate. Patient reports some mild intermittent sciatica symptoms, expressed pain to be dull and constant in the lower aspect of her back. Physical exertion and ambulation makes it worse. Patient denies fever, chills, abdominal pain, melena, hematochezia, dysuria, stools or urine incontinence, chest pain, shortness of breath, nausea, vomiting or sick contacts. She continues to smoke.  -Patient was admitted with acute on chronic back pain and inability to walk.  She had spinal imaging demonstrating extensive degenerative changes and significant narrowing of the spinal canal with intermittent sciatica symptoms, but no signs of cauda equina.  Case was discussed with neurosurgery who recommended conservative management and outpatient follow-up.  She was placed on pain medications, muscle relaxers, and IV steroids with vast improvement in  her back pain, however she continues to have significant weakness with ambulation.  She was seen by PT with recommendations for SNF, but patient refuses this at this time and will be set up with home health equipment as well as home health PT.  Son will be at home to take care of the patient and she appears to be overall safe for discharge.  Discharge Diagnoses:  Principal Problem:   Back pain Active Problems:   COPD (chronic obstructive pulmonary disease) (HCC)   Hypertension   Obesity   Gout  Principal discharge diagnosis: Acute on chronic back pain with lower extremity weakness in the setting of extensive degenerative changes with narrowing of the spinal canal.  Discharge Instructions  Discharge Instructions    Diet - low sodium heart healthy   Complete by: As directed    Increase activity slowly   Complete by: As directed    No wound care   Complete by: As directed      Allergies as of 09/26/2020      Reactions   Hctz [hydrochlorothiazide] Rash      Medication List    STOP taking these medications   colchicine 0.6 MG tablet   lisinopril 30 MG tablet Commonly known as: ZESTRIL   oxyCODONE-acetaminophen 5-325 MG tablet Commonly known as: PERCOCET/ROXICET     TAKE these medications   albuterol-ipratropium 18-103 MCG/ACT inhaler Commonly known as: COMBIVENT Inhale 2 puffs into the lungs every 4 (four) hours as needed for wheezing or shortness of breath.   aspirin EC 81 MG tablet Take 81 mg by mouth daily.   Dialyvite Vitamin D3 Max 1.25 MG (50000 UT) Tabs Generic drug: Cholecalciferol Take 1 tablet by mouth once a week.   gabapentin 100 MG capsule Commonly known as: NEURONTIN Take 1 capsule (100  mg total) by mouth 2 (two) times daily.   HYDROcodone-acetaminophen 5-325 MG tablet Commonly known as: NORCO/VICODIN Take 1-2 tablets by mouth every 6 (six) hours as needed for moderate pain or severe pain. What changed: reasons to take this   ibuprofen 200 MG  tablet Commonly known as: ADVIL Take 400 mg by mouth every 6 (six) hours as needed for mild pain.   indomethacin 25 MG capsule Commonly known as: INDOCIN Take 1 capsule (25 mg total) by mouth 3 (three) times daily as needed.   methocarbamol 500 MG tablet Commonly known as: Robaxin Take 1 tablet (500 mg total) by mouth every 8 (eight) hours as needed for muscle spasms.   metoprolol tartrate 25 MG tablet Commonly known as: LOPRESSOR Take 1 tablet (25 mg total) by mouth 2 (two) times daily. What changed:   medication strength  how much to take   NIFEdipine 90 MG 24 hr tablet Commonly known as: PROCARDIA XL/NIFEDICAL-XL Take 90 mg by mouth daily.   pantoprazole 20 MG tablet Commonly known as: PROTONIX Take 2 tablets (40 mg total) by mouth daily.   predniSONE 10 MG tablet Commonly known as: DELTASONE Take 4 tablets (40 mg total) by mouth daily for 5 days.            Durable Medical Equipment  (From admission, onward)         Start     Ordered   09/26/20 1049  For home use only DME 3 n 1  Once        09/26/20 1049   09/26/20 1048  For home use only DME standard manual wheelchair with seat cushion  Once       Comments: Patient suffers from weakness which impairs their ability to perform daily activities like ADLs in the home.  A walking aid will not resolve issue with performing activities of daily living. A wheelchair will allow patient to safely perform daily activities. Patient can safely propel the wheelchair in the home or has a caregiver who can provide assistance. Length of need 6 months. Accessories: elevating leg rests (ELRs), wheel locks, extensions and anti-tippers.   09/26/20 1049          Follow-up Information    [facility] Follow up.   Specialty: Home Health Services Why:  PT- will call to set up first home visit.  Contact information: [address] [place] [address] [phone]        [facility] Follow up in 1 week(s).   Contact information: [address] [place] [address] [phone]              Allergies  Allergen Reactions  . Hctz [Hydrochlorothiazide] Rash    Consultations:  Discussed case with neurosurgery   Procedures/Studies: CT Lumbar Spine Wo Contrast  Result Date: 09/21/2020 CLINICAL DATA:  Intermittent low back pain and spasms for approximately 1 month. The patient lost the ability to move her legs 2 weeks ago. EXAM: CT LUMBAR SPINE WITHOUT CONTRAST TECHNIQUE: Multidetector CT imaging of the lumbar spine was performed without intravenous contrast administration. Multiplanar CT image reconstructions were also generated. COMPARISON:  Report of CT chest 05/03/2013 reviewed. Images are not available. FINDINGS: Segmentation: The patient has transitional lumbosacral anatomy. Ribs are seen off the first lumbar vertebral body. On this study, the last fully open disc space is labeled L5-S1. Alignment: Trace facet mediated anterolisthesis L4 on L5. Vertebrae: No fracture or focal pathologic process. Marked  degenerative endplate sclerosis is present at L4-5. Paraspinal and other soft tissues: There is a calcified left paraspinal mass which is likely the patient's left adrenal gland measures 5.3 x 3.4 cm. This is described on the report of the prior CT and is described as 2 x3 cm in size. Atherosclerotic vascular disease also noted. Sigmoid diverticulosis is also seen. Disc levels: T10-11: Facet degenerative disease and a disc bulge. Moderate to moderately severe central canal stenosis and bilateral foraminal narrowing. T11-12: Facet degenerative disease.  Otherwise negative. T12-L1: Facet degenerative change.  Otherwise negative. L1-2: Moderate bilateral facet degenerative change. Minimal disc bulge. No stenosis. L2-3: Mild-to-moderate facet degenerative change. No disc bulge or stenosis. L3-4: Moderately severe to severe bilateral facet arthropathy. Shallow disc  bulge. Mild-to-moderate central canal and bilateral foraminal narrowing. L4-5: Vacuum disc phenomenon. Broad-based disc bulge, ligamentum flavum thickening, endplate spurring and severe facet degenerative disease. Severe appearing central canal and bilateral foraminal narrowing. L5-S1: Advanced bilateral facet degenerative change. Vacuum disc phenomenon with a disc bulge. Mild to moderate central canal stenosis. Severe bilateral foraminal narrowing is worse on the left. IMPRESSION: Left adrenal lesion has enlarged in size based on description the previous report. Images are not available. Recommend non emergent adrenal protocol MRI with and without contrast for further evaluation. Multilevel spondylosis appears worst at L4-5 where there is marked central canal and bilateral foraminal narrowing. Advanced facet degenerative change is present is level. Severe bilateral foraminal narrowing at L5-S1 where there is also advanced facet degenerative change and mild to moderate central canal stenosis. Moderate to moderately severe appearing central canal and bilateral foraminal stenosis at T10-11. Transitional lumbosacral anatomy. Please see numbering scheme above and correlate with plain films if any intervention is planned. Electronically Signed   By: Drusilla Kanner M.D.   On: 09/21/2020 11:27      Discharge Exam: Vitals:   09/26/20 0555 09/26/20 0831  BP: (!) 149/79 (!) 148/79  Pulse: (!) 55 61  Resp: (!) 21   Temp:    SpO2: 99%    Vitals:   09/25/20 1358 09/25/20 2255 09/26/20 0555 09/26/20 0831  BP: 108/62 135/64 (!) 149/79 (!) 148/79  Pulse: 74 67 (!) 55 61  Resp: 17 20 (!) 21   Temp: 98.3 F (36.8 C) 98 F (36.7 C)    TempSrc: Oral Oral Oral   SpO2: 100% 99% 99%   Weight:      Height:        General: Pt is alert, awake, not in acute distress, morbidly obese Cardiovascular: RRR, S1/S2 +, no rubs, no gallops Respiratory: CTA bilaterally, no wheezing, no rhonchi Abdominal: Soft, NT, ND,  bowel sounds + Extremities: no edema, no cyanosis    The results of significant diagnostics from this hospitalization (including imaging, microbiology, ancillary and laboratory) are listed below for reference.     Microbiology: Recent Results (from the past 240 hour(s))  SARS CORONAVIRUS 2 (TAT 6-24 HRS) Nasopharyngeal Nasopharyngeal Swab     Status: None   Collection Time: 09/21/20  4:09 PM   Specimen: Nasopharyngeal Swab  Result Value Ref Range Status   SARS Coronavirus 2 NEGATIVE NEGATIVE Final    Comment: (NOTE) SARS-CoV-2 target nucleic acids are NOT DETECTED.  The SARS-CoV-2 RNA is generally detectable in upper and lower respiratory specimens during the acute phase of infection. Negative results do not preclude SARS-CoV-2 infection, do not rule out co-infections with other pathogens, and should not be used as the sole basis for treatment or other patient management decisions.  Negative results must be combined with clinical observations, patient history, and epidemiological information. The expected result is Negative.  Fact Sheet for Patients: HairSlick.no  Fact Sheet for Healthcare Providers: quierodirigir.com  This test is not yet approved or cleared by the [place] FDA and  has been authorized for detection and/or diagnosis of SARS-CoV-2 by FDA under an Emergency Use Authorization (EUA). This EUA will remain  in effect (meaning this test can be used) for the duration of the COVID-19 declaration under Se ction 564(b)(1) of the Act, 21 U.S.C. section 360bbb-3(b)(1), unless the authorization is terminated or revoked sooner.  Performed at [facility], [address], [place], [place] [address]   Resp Panel by RT-PCR (Flu A&B, Covid) Nasopharyngeal Swab     Status: None   Collection Time: 09/25/20  2:57 PM   Specimen: Nasopharyngeal Swab; Nasopharyngeal(NP) swabs in vial transport medium  Result Value  Ref Range Status   SARS Coronavirus 2 by RT PCR NEGATIVE NEGATIVE Final    Comment: (NOTE) SARS-CoV-2 target nucleic acids are NOT DETECTED.  The SARS-CoV-2 RNA is generally detectable in upper respiratory specimens during the acute phase of infection. The lowest concentration of SARS-CoV-2 viral copies this assay can detect is 138 copies/mL. A negative result does not preclude SARS-Cov-2 infection and should not be used as the sole basis for treatment or other patient management decisions. A negative result may occur with  improper specimen collection/handling, submission of specimen other than nasopharyngeal swab, presence of viral mutation(s) within the areas targeted by this assay, and inadequate number of viral copies(<138 copies/mL). A negative result must be combined with clinical observations, patient history, and epidemiological information. The expected result is Negative.  Fact Sheet for Patients:  BloggerCourse.com  Fact Sheet for Healthcare Providers:  SeriousBroker.it  This test is no t yet approved or cleared by the [place] FDA and  has been authorized for detection and/or diagnosis of SARS-CoV-2 by FDA under an Emergency Use Authorization (EUA). This EUA will remain  in effect (meaning this test can be used) for the duration of the COVID-19 declaration under Section 564(b)(1) of the Act, 21 U.S.C.section 360bbb-3(b)(1), unless the authorization is terminated  or revoked sooner.       Influenza A by PCR NEGATIVE NEGATIVE Final   Influenza B by PCR NEGATIVE NEGATIVE Final    Comment: (NOTE) The Xpert Xpress SARS-CoV-2/FLU/RSV plus assay is intended as an aid in the diagnosis of influenza from Nasopharyngeal swab specimens and should not be used as a sole basis for treatment. Nasal washings and aspirates are unacceptable for Xpert Xpress SARS-CoV-2/FLU/RSV testing.  Fact Sheet for  Patients: BloggerCourse.com  Fact Sheet for Healthcare Providers: SeriousBroker.it  This test is not yet approved or cleared by the [place] FDA and has been authorized for detection and/or diagnosis of SARS-CoV-2 by FDA under an Emergency Use Authorization (EUA). This EUA will remain in effect (meaning this test can be used) for the duration of the COVID-19 declaration under Section 564(b)(1) of the Act, 21 U.S.C. section 360bbb-3(b)(1), unless the authorization is terminated or revoked.  Performed at [facility], [address], [place], [place] [address]      Labs: BNP (last 3 results) No results for input(s): BNP in the last 8760 hours. Basic Metabolic Panel: Recent Labs  Lab 09/21/20 1029 09/21/20 1428 09/22/20 0603 09/24/20 0400 09/25/20 0351 09/26/20 0432  NA 134*  --  134* 136 141 137  K 3.6  --  4.0 4.2 4.9  4.6  CL 100  --  103 105 105 104  CO2 21*  --  18* 23 26 26   GLUCOSE 119*  --  122* 129* 163* 146*  BUN 57*  --  57* 57* 51* 42*  CREATININE 1.59*  --  1.32* 0.96 0.96 0.91  CALCIUM 10.3  --  10.1 9.5 9.7 9.2  MG  --  2.3  --   --   --  1.9  PHOS  --  4.7*  --   --   --   --    Liver Function Tests: Recent Labs  Lab 09/21/20 1029  AST 38  ALT 30  ALKPHOS 225*  BILITOT 2.4*  PROT 7.9  ALBUMIN 2.8*   No results for input(s): LIPASE, AMYLASE in the last 168 hours. No results for input(s): AMMONIA in the last 168 hours. CBC: Recent Labs  Lab 09/21/20 1029 09/22/20 0603 09/24/20 0400 09/25/20 0351 09/26/20 0432  WBC 23.3* 22.0* 18.6* 14.1* 14.2*  NEUTROABS 19.3*  --   --   --   --   HGB 13.1 11.6* 11.2* 11.8* 11.5*  HCT 40.6 36.9 35.9* 38.1 37.9  MCV 92.9 94.6 94.5 95.7 96.4  PLT 565* 493* 537* 551* 522*   Cardiac Enzymes: No results for input(s): CKTOTAL, CKMB, CKMBINDEX, TROPONINI in the last 168 hours. BNP: Invalid input(s): POCBNP CBG: Recent Labs  Lab 09/25/20 1104  09/25/20 1520 09/25/20 2117 09/26/20 0658 09/26/20 0735  GLUCAP 137* 155* 232* 157* 143*   D-Dimer No results for input(s): DDIMER in the last 72 hours. Hgb A1c No results for input(s): HGBA1C in the last 72 hours. Lipid Profile No results for input(s): CHOL, HDL, LDLCALC, TRIG, CHOLHDL, LDLDIRECT in the last 72 hours. Thyroid function studies No results for input(s): TSH, T4TOTAL, T3FREE, THYROIDAB in the last 72 hours.  Invalid input(s): FREET3 Anemia work up No results for input(s): VITAMINB12, FOLATE, FERRITIN, TIBC, IRON, RETICCTPCT in the last 72 hours. Urinalysis    Component Value Date/Time   COLORURINE AMBER (A) 09/21/2020 2104   APPEARANCEUR HAZY (A) 09/21/2020 2104   LABSPEC 1.016 09/21/2020 2104   PHURINE 5.0 09/21/2020 2104   GLUCOSEU NEGATIVE 09/21/2020 2104   HGBUR SMALL (A) 09/21/2020 2104   BILIRUBINUR NEGATIVE 09/21/2020 2104   KETONESUR NEGATIVE 09/21/2020 2104   PROTEINUR 30 (A) 09/21/2020 2104   NITRITE NEGATIVE 09/21/2020 2104   LEUKOCYTESUR SMALL (A) 09/21/2020 2104   Sepsis Labs Invalid input(s): PROCALCITONIN,  WBC,  LACTICIDVEN Microbiology Recent Results (from the past 240 hour(s))  SARS CORONAVIRUS 2 (TAT 6-24 HRS) Nasopharyngeal Nasopharyngeal Swab     Status: None   Collection Time: 09/21/20  4:09 PM   Specimen: Nasopharyngeal Swab  Result Value Ref Range Status   SARS Coronavirus 2 NEGATIVE NEGATIVE Final    Comment: (NOTE) SARS-CoV-2 target nucleic acids are NOT DETECTED.  The SARS-CoV-2 RNA is generally detectable in upper and lower respiratory specimens during the acute phase of infection. Negative results do not preclude SARS-CoV-2 infection, do not rule out co-infections with other pathogens, and should not be used as the sole basis for treatment or other patient management decisions. Negative results must be combined with clinical observations, patient history, and epidemiological information. The expected result is  Negative.  Fact Sheet for Patients: 09/23/20  Fact Sheet for Healthcare Providers: HairSlick.no  This test is not yet approved or cleared by the [place] FDA and  has been authorized for detection and/or diagnosis of SARS-CoV-2 by FDA under an Emergency  Use Authorization (EUA). This EUA will remain  in effect (meaning this test can be used) for the duration of the COVID-19 declaration under Se ction 564(b)(1) of the Act, 21 U.S.C. section 360bbb-3(b)(1), unless the authorization is terminated or revoked sooner.  Performed at [facility], [address] [place], [place] [address]   Resp Panel by RT-PCR (Flu A&B, Covid) Nasopharyngeal Swab     Status: None   Collection Time: 09/25/20  2:57 PM   Specimen: Nasopharyngeal Swab; Nasopharyngeal(NP) swabs in vial transport medium  Result Value Ref Range Status   SARS Coronavirus 2 by RT PCR NEGATIVE NEGATIVE Final    Comment: (NOTE) SARS-CoV-2 target nucleic acids are NOT DETECTED.  The SARS-CoV-2 RNA is generally detectable in upper respiratory specimens during the acute phase of infection. The lowest concentration of SARS-CoV-2 viral copies this assay can detect is 138 copies/mL. A negative result does not preclude SARS-Cov-2 infection and should not be used as the sole basis for treatment or other patient management decisions. A negative result may occur with  improper specimen collection/handling, submission of specimen other than nasopharyngeal swab, presence of viral mutation(s) within the areas targeted by this assay, and inadequate number of viral copies(<138 copies/mL). A negative result must be combined with clinical observations, patient history, and epidemiological information. The expected result is Negative.  Fact Sheet for Patients:  BloggerCourse.comhttps://www.fda.gov/media/152166/download  Fact Sheet for Healthcare Providers:   SeriousBroker.ithttps://www.fda.gov/media/152162/download  This test is no t yet approved or cleared by the [place] FDA and  has been authorized for detection and/or diagnosis of SARS-CoV-2 by FDA under an Emergency Use Authorization (EUA). This EUA will remain  in effect (meaning this test can be used) for the duration of the COVID-19 declaration under Section 564(b)(1) of the Act, 21 U.S.C.section 360bbb-3(b)(1), unless the authorization is terminated  or revoked sooner.       Influenza A by PCR NEGATIVE NEGATIVE Final   Influenza B by PCR NEGATIVE NEGATIVE Final    Comment: (NOTE) The Xpert Xpress SARS-CoV-2/FLU/RSV plus assay is intended as an aid in the diagnosis of influenza from Nasopharyngeal swab specimens and should not be used as a sole basis for treatment. Nasal washings and aspirates are unacceptable for Xpert Xpress SARS-CoV-2/FLU/RSV testing.  Fact Sheet for Patients: BloggerCourse.comhttps://www.fda.gov/media/152166/download  Fact Sheet for Healthcare Providers: SeriousBroker.ithttps://www.fda.gov/media/152162/download  This test is not yet approved or cleared by the [place] FDA and has been authorized for detection and/or diagnosis of SARS-CoV-2 by FDA under an Emergency Use Authorization (EUA). This EUA will remain in effect (meaning this test can be used) for the duration of the COVID-19 declaration under Section 564(b)(1) of the Act, 21 U.S.C. section 360bbb-3(b)(1), unless the authorization is terminated or revoked.  Performed at [facility], [address] [place], [place] [address]      Time coordinating discharge: 35 minutes  SIGNED:   Erick BlinksPratik D Shah, DO [facility] 09/26/2020, 10:53 AM  If 7PM-7AM, please contact night-coverage [email or web address]

## 2021-03-11 ENCOUNTER — Other Ambulatory Visit (HOSPITAL_COMMUNITY): Payer: Self-pay | Admitting: Neurosurgery

## 2021-03-11 ENCOUNTER — Other Ambulatory Visit: Payer: Self-pay | Admitting: Neurosurgery

## 2021-03-11 DIAGNOSIS — M48062 Spinal stenosis, lumbar region with neurogenic claudication: Secondary | ICD-10-CM

## 2021-03-24 ENCOUNTER — Ambulatory Visit (HOSPITAL_COMMUNITY)
Admission: RE | Admit: 2021-03-24 | Discharge: 2021-03-24 | Disposition: A | Discharge status: Home / Self Care | Payer: BC Managed Care – PPO | Source: Ambulatory Visit | Attending: Neurosurgery | Admitting: Neurosurgery

## 2021-03-24 ENCOUNTER — Other Ambulatory Visit: Payer: Self-pay

## 2021-03-24 DIAGNOSIS — M48062 Spinal stenosis, lumbar region with neurogenic claudication: Secondary | ICD-10-CM | POA: Insufficient documentation

## 2021-06-12 ENCOUNTER — Other Ambulatory Visit (HOSPITAL_COMMUNITY): Payer: Self-pay | Admitting: Internal Medicine

## 2021-06-12 DIAGNOSIS — Z1231 Encounter for screening mammogram for malignant neoplasm of breast: Secondary | ICD-10-CM

## 2021-06-29 ENCOUNTER — Ambulatory Visit (HOSPITAL_COMMUNITY)
Admission: RE | Admit: 2021-06-29 | Discharge: 2021-06-29 | Disposition: A | Discharge status: Home / Self Care | Payer: BC Managed Care – PPO | Source: Ambulatory Visit | Attending: Internal Medicine | Admitting: Internal Medicine

## 2021-06-29 ENCOUNTER — Other Ambulatory Visit: Payer: Self-pay

## 2021-06-29 DIAGNOSIS — Z1231 Encounter for screening mammogram for malignant neoplasm of breast: Secondary | ICD-10-CM | POA: Diagnosis not present

## 2022-01-30 IMAGING — MG MM DIGITAL SCREENING BILAT W/ TOMO AND CAD
6 of 9 series · 6 of 25 positions shown · non-contrast
Comparison: None.

CLINICAL DATA: Screening.

EXAM:
DIGITAL SCREENING BILATERAL MAMMOGRAM WITH TOMOSYNTHESIS AND CAD
TECHNIQUE: Bilateral screening digital craniocaudal and mediolateral oblique
mammograms were obtained. Bilateral screening digital breast
tomosynthesis was performed. The images were evaluated with
computer-aided detection.

[R CV]
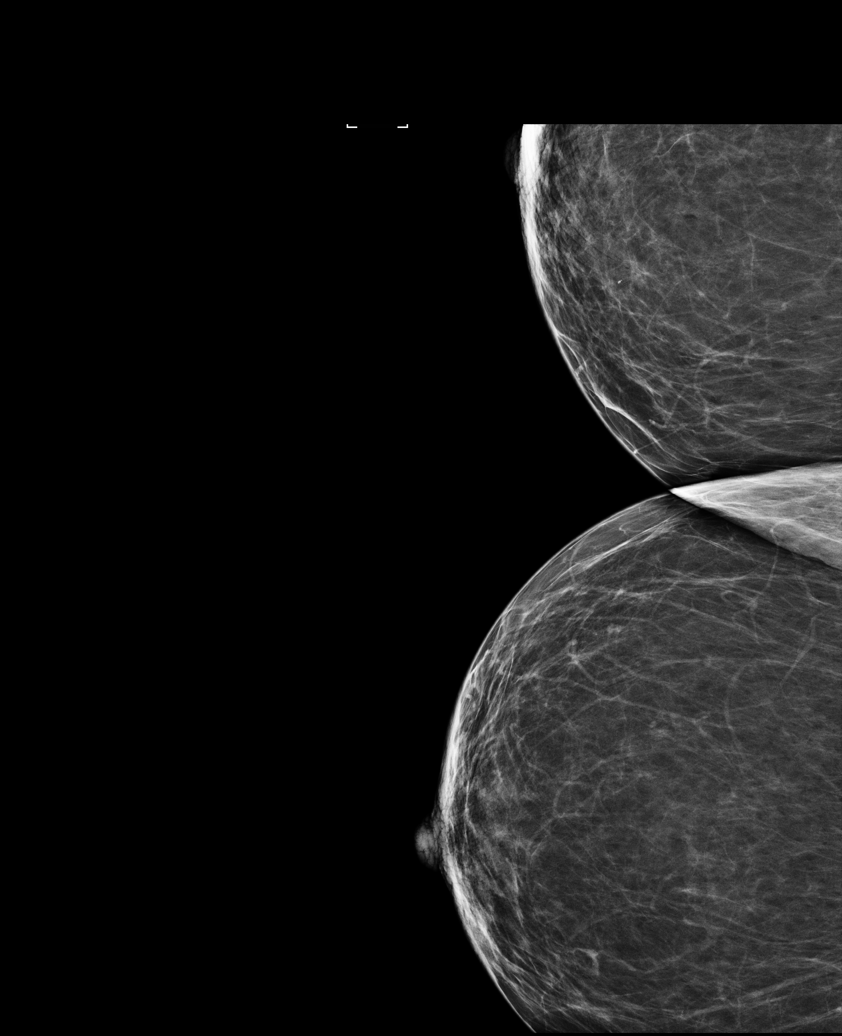

[L MLO synth-2D]
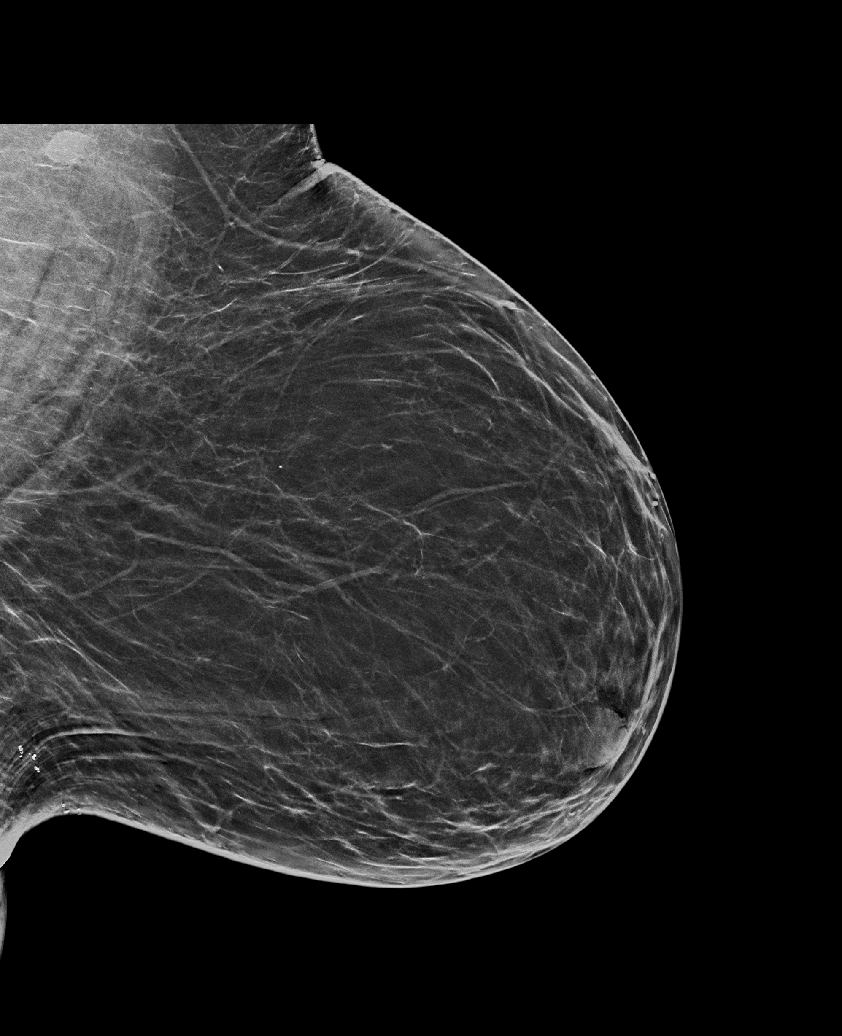

[L CC synth-2D]
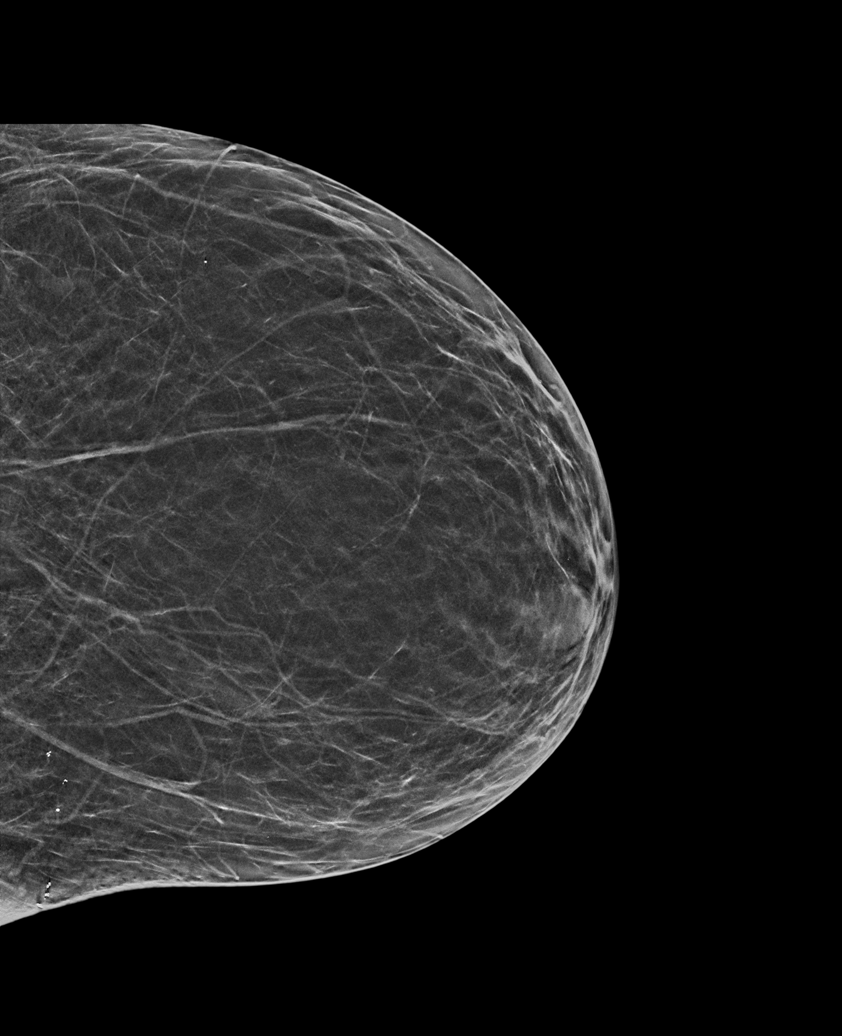

[R CC synth-2D]
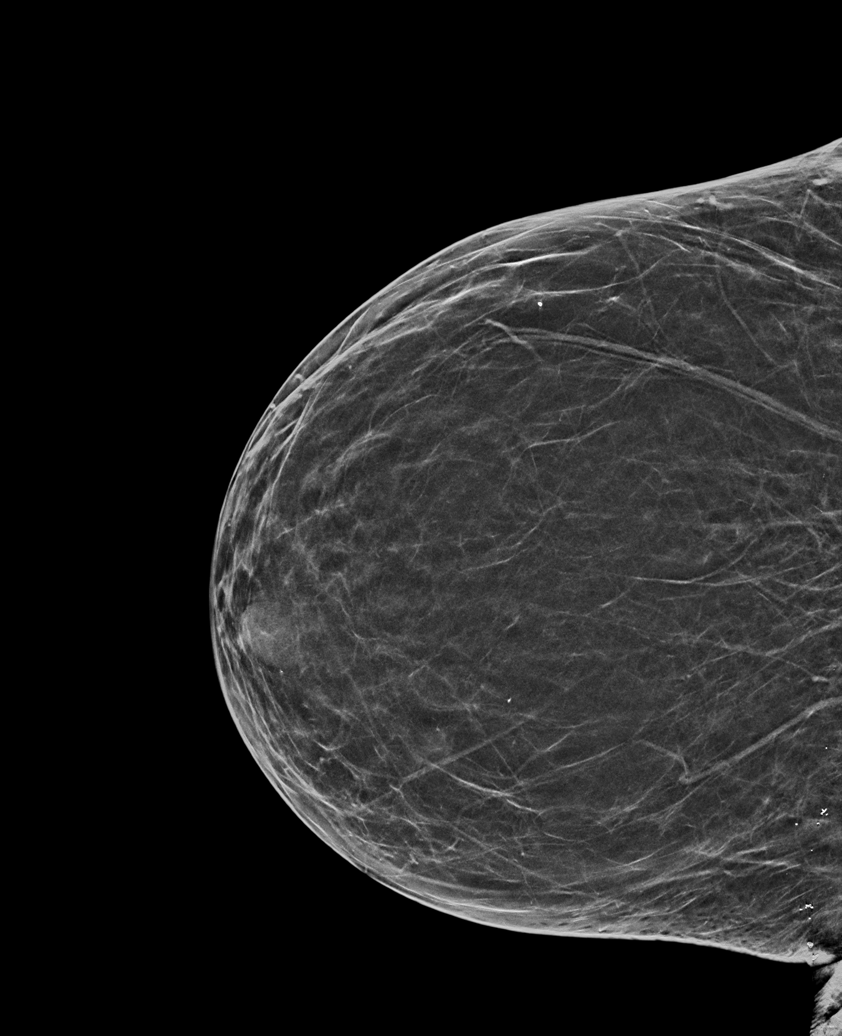

[R MLO synth-2D]
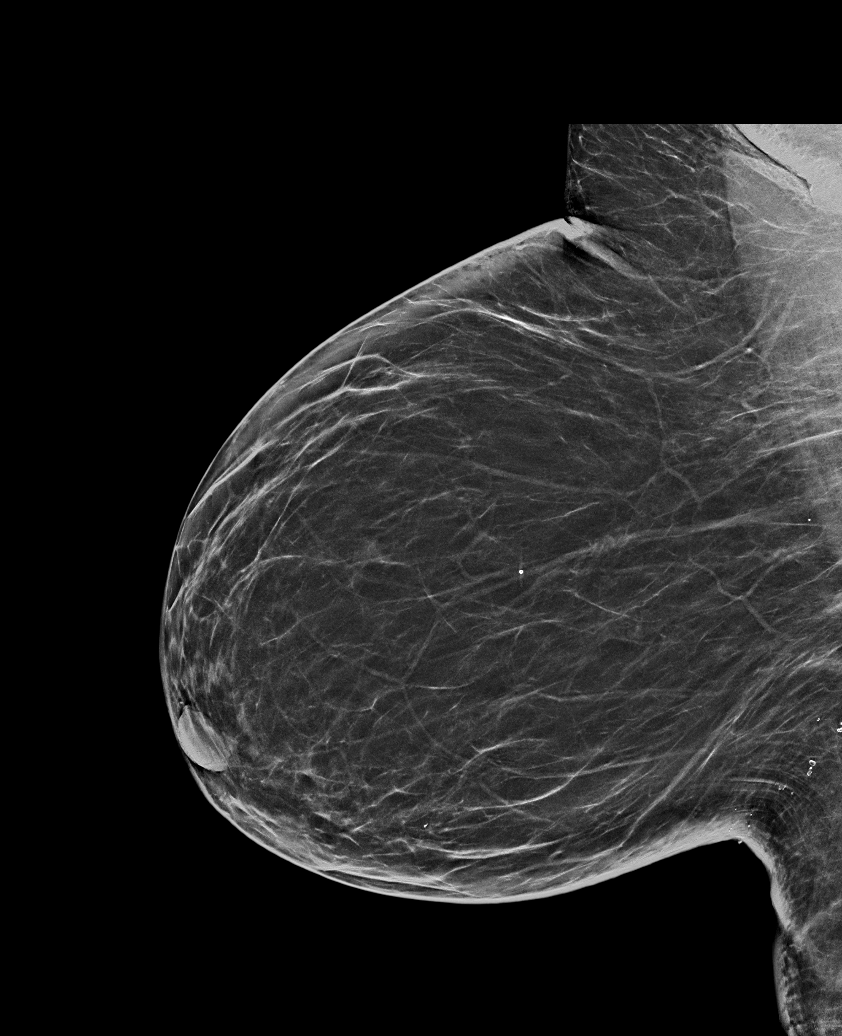

[R MLO tomo · tomo slice 35/69.0]
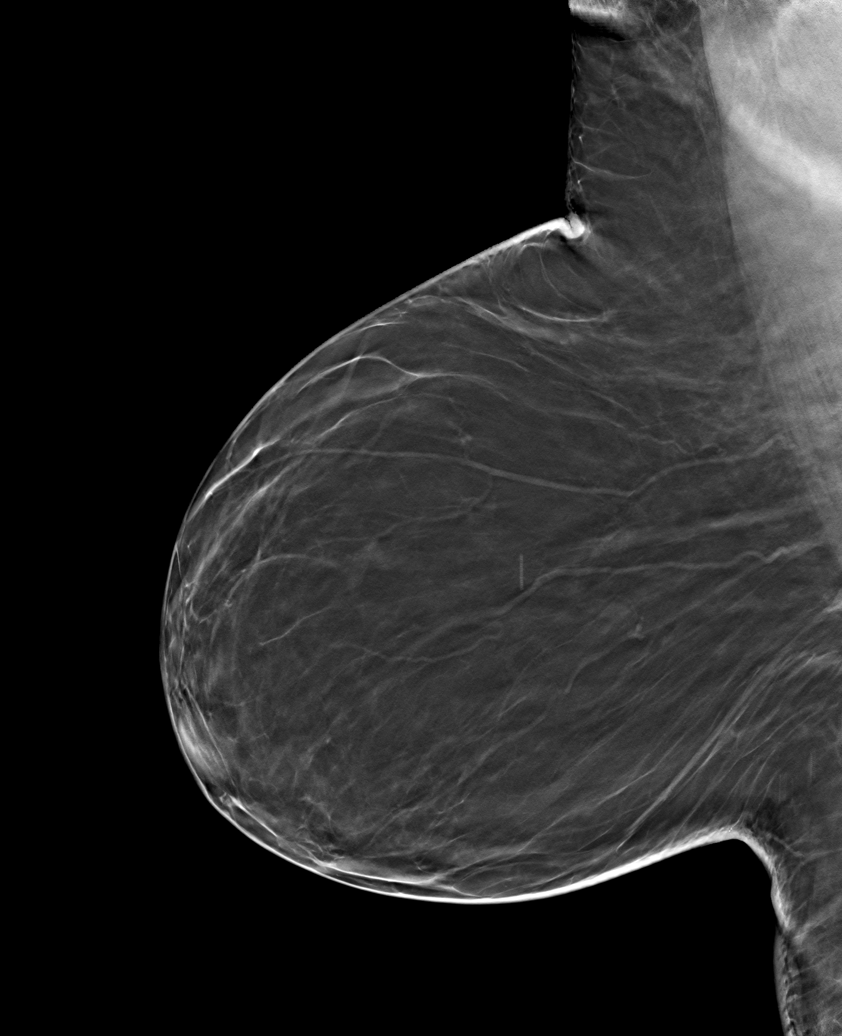

[6 of 25 positions shown; findings below may reference images not displayed]

ACR Breast Density Category b: There are scattered areas of
fibroglandular density.
FINDINGS: There are no findings suspicious for malignancy.
IMPRESSION: No mammographic evidence of malignancy. A result letter of this
screening mammogram will be mailed directly to the patient.

RECOMMENDATION:
Screening mammogram in one year. (Code:XG-[id number])

BI-RADS CATEGORY  1: Negative.

## 2024-08-18 ENCOUNTER — Emergency Department (HOSPITAL_COMMUNITY)
Admission: EM | Admit: 2024-08-18 | Discharge: 2024-08-19 | Disposition: A | Payer: Self-pay | Attending: Emergency Medicine | Admitting: Emergency Medicine

## 2024-08-18 ENCOUNTER — Other Ambulatory Visit: Payer: Self-pay

## 2024-08-18 ENCOUNTER — Encounter (HOSPITAL_COMMUNITY): Payer: Self-pay

## 2024-08-18 DIAGNOSIS — Z7982 Long term (current) use of aspirin: Secondary | ICD-10-CM | POA: Insufficient documentation

## 2024-08-18 DIAGNOSIS — J449 Chronic obstructive pulmonary disease, unspecified: Secondary | ICD-10-CM | POA: Insufficient documentation

## 2024-08-18 DIAGNOSIS — R002 Palpitations: Secondary | ICD-10-CM | POA: Insufficient documentation

## 2024-08-18 DIAGNOSIS — Z79899 Other long term (current) drug therapy: Secondary | ICD-10-CM | POA: Insufficient documentation

## 2024-08-18 DIAGNOSIS — I10 Essential (primary) hypertension: Secondary | ICD-10-CM | POA: Insufficient documentation

## 2024-08-18 LAB — COMPREHENSIVE METABOLIC PANEL WITH GFR
ALT: 13 U/L (ref 0–44)
AST: 17 U/L (ref 15–41)
Albumin: 4.2 g/dL (ref 3.5–5.0)
Alkaline Phosphatase: 129 U/L — ABNORMAL HIGH (ref 38–126)
Anion gap: 12 (ref 5–15)
BUN: 15 mg/dL (ref 8–23)
CO2: 25 mmol/L (ref 22–32)
Calcium: 9.1 mg/dL (ref 8.9–10.3)
Chloride: 103 mmol/L (ref 98–111)
Creatinine, Ser: 0.94 mg/dL (ref 0.44–1.00)
GFR, Estimated: >60 mL/min
Glucose, Bld: 125 mg/dL — ABNORMAL HIGH (ref 70–99)
Potassium: 4.2 mmol/L (ref 3.5–5.1)
Sodium: 140 mmol/L (ref 135–145)
Total Bilirubin: 0.7 mg/dL (ref 0.0–1.2)
Total Protein: 7.4 g/dL (ref 6.5–8.1)

## 2024-08-18 LAB — CBC WITH DIFFERENTIAL/PLATELET
Abs Immature Granulocytes: 0.03 K/uL (ref 0.00–0.07)
Basophils Absolute: 0.1 K/uL (ref 0.0–0.1)
Basophils Relative: 1 %
Eosinophils Absolute: 0.2 K/uL (ref 0.0–0.5)
Eosinophils Relative: 2 %
HCT: 44.5 % (ref 36.0–46.0)
Hemoglobin: 14.3 g/dL (ref 12.0–15.0)
Immature Granulocytes: 0 %
Lymphocytes Relative: 22 %
Lymphs Abs: 1.7 K/uL (ref 0.7–4.0)
MCH: 30.6 pg (ref 26.0–34.0)
MCHC: 32.1 g/dL (ref 30.0–36.0)
MCV: 95.1 fL (ref 80.0–100.0)
Monocytes Absolute: 0.6 K/uL (ref 0.1–1.0)
Monocytes Relative: 8 %
Neutro Abs: 4.9 K/uL (ref 1.7–7.7)
Neutrophils Relative %: 67 %
Platelets: 229 K/uL (ref 150–400)
RBC: 4.68 MIL/uL (ref 3.87–5.11)
RDW: 13.2 % (ref 11.5–15.5)
WBC: 7.5 K/uL (ref 4.0–10.5)
nRBC: 0 % (ref 0.0–0.2)

## 2024-08-18 LAB — TROPONIN T, HIGH SENSITIVITY: Troponin T High Sensitivity: <15 ng/L (ref 0–19)

## 2024-08-18 NOTE — ED Provider Notes (Signed)
 " [place] [facility] DE[facility]MENT AT [facility] Provider Note   CSN: [id number] Arrival date & time: 08/18/24  2201     Patient presents with: Palpitations (Heart flutter)   [name] is a 64 y.o. female.  {Add pertinent medical, surgical, social history, OB history to [id number]}  Palpitations Patient presents for palpitations.  Medical history includes HTN, gout, COPD.  Onset of palpitations was 6 PM tonight.  She denies any other associated symptoms.     Prior to Admission medications  Medication Sig Start Date End Date Taking? Authorizing Provider  albuterol -ipratropium (COMBIVENT) 18-103 MCG/ACT inhaler Inhale 2 puffs into the lungs every 4 (four) hours as needed for wheezing or shortness of breath. Patient not taking: No sig reported 04/03/12 04/03/13  Floretta Bethanne CROME, MD  aspirin  EC 81 MG tablet Take 81 mg by mouth daily.    [provider]  DIALYVITE VITAMIN D3 MAX 1.25 MG (50000 UT) TABS Take 1 tablet by mouth once a week. 04/25/20   [provider]  gabapentin  (NEURONTIN ) 100 MG capsule Take 1 capsule (100 mg total) by mouth 2 (two) times daily. 09/25/20 10/25/20  Maree, Pratik D, DO  HYDROcodone -acetaminophen  (NORCO/VICODIN) 5-325 MG tablet Take 1-2 tablets by mouth every 6 (six) hours as needed for moderate pain or severe pain. 09/25/20   Maree, Pratik D, DO  ibuprofen (ADVIL,MOTRIN) 200 MG tablet Take 400 mg by mouth every 6 (six) hours as needed for mild pain.    [provider]  indomethacin  (INDOCIN ) 25 MG capsule Take 1 capsule (25 mg total) by mouth 3 (three) times daily as needed. 06/06/16   Sofia, Leslie K, PA-C  methocarbamol  (ROBAXIN ) 500 MG tablet Take 1 tablet (500 mg total) by mouth every 8 (eight) hours as needed for muscle spasms. 09/25/20   Maree, Pratik D, DO  metoprolol  tartrate (LOPRESSOR ) 25 MG tablet Take 1 tablet (25 mg total) by mouth 2 (two) times daily. 09/25/20 10/25/20  Maree, Pratik D, DO  NIFEdipine  (PROCARDIA   XL/ADALAT -CC) 90 MG 24 hr tablet Take 90 mg by mouth daily.    [provider]  pantoprazole  (PROTONIX ) 20 MG tablet Take 2 tablets (40 mg total) by mouth daily. Patient not taking: No sig reported 10/28/15   Armida Culver, PA-C    Allergies: Hctz [hydrochlorothiazide]    Review of Systems  Cardiovascular:  Positive for palpitations.    Updated Vital Signs BP (!) 205/81 (BP Location: Right Arm)   Pulse 62   Temp 98.7 F (37.1 C) (Oral)   Resp 20   SpO2 98%   Physical Exam  (all labs ordered are listed, but only abnormal results are displayed) Labs Reviewed  COMPREHENSIVE METABOLIC PANEL WITH GFR - Abnormal; Notable for the following components:      Result Value   Glucose, Bld 125 (*)    Alkaline Phosphatase 129 (*)    All other components within normal limits  CBC WITH DIFFERENTIAL/PLATELET  TROPONIN T, HIGH SENSITIVITY  TROPONIN T, HIGH SENSITIVITY    EKG: EKG Interpretation Date/Time:  Saturday August 18 2024 22:13:39 EST Ventricular Rate:  61 PR Interval:  150 QRS Duration:  88 QT Interval:  430 QTC Calculation: 432 R Axis:   -28  Text Interpretation: Normal sinus rhythm Normal ECG When compared with ECG of 28-Oct-2015 10:04, improved ST,Ts from prior 3/17 Confirmed by Towana Sharper [phone]) on 08/18/2024 10:19:17 PM  Radiology: No results found.  {Document cardiac monitor, telemetry assessment procedure when appropriate:32947} Procedures  Medications Ordered in the ED - No data to display    {Click here for ABCD2, HEART and other calculators REFRESH Note before signing:1}                              Medical Decision Making Amount and/or Complexity of Data Reviewed Labs: ordered.   ***  {Document critical care time when appropriate  Document review of labs and clinical decision tools ie [id number], etc  Document your independent review of radiology images and any outside records  Document your discussion with family members,  caretakers and with consultants  Document social determinants of health affecting pt's care  Document your decision making why or why not admission, treatments were needed:32947:::1}   Final diagnoses:  None    ED Discharge Orders     None        "

## 2024-08-18 NOTE — ED Triage Notes (Signed)
 Pt comes for feeling of heart flutter. Pt states flutter started around 1800 tonight. Pt has only been dx with HTN. Pt wants to make sure nothing is wrong with her heart.   Pt denies any n/v, CP, or vertigo  Pt took tums to see if it indigestion but did not help.

## 2024-08-19 LAB — TROPONIN T, HIGH SENSITIVITY: Troponin T High Sensitivity: <15 ng/L (ref 0–19)

## 2024-08-19 NOTE — Discharge Instructions (Signed)
 Your test results today were reassuring.  You should follow-up with a cardiologist for consideration of a wearable heart monitor.  A cardiology referral has been ordered.  If you do not hear from the cardiology office in the next few days, call the telephone number below to set up that follow-up appointment.  Return to the emergency department for any new or worsening symptoms of concern.
